# Patient Record
Sex: Female | Born: 1985 | Race: Black or African American | Hispanic: No | Marital: Single | Smoking: Current every day smoker
Health system: Southern US, Community
[De-identification: ages and names within clinical notes are randomized; demographics above are authoritative.]

## PROBLEM LIST (undated history)

## (undated) DIAGNOSIS — Z789 Other specified health status: Secondary | ICD-10-CM

## (undated) HISTORY — PX: NOSE SURGERY: SHX723

---
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
## 2006-03-15 ENCOUNTER — Emergency Department (HOSPITAL_COMMUNITY): Admission: EM | Admit: 2006-03-15 | Discharge: 2006-03-15 | Payer: Self-pay | Admitting: Family Medicine

## 2006-06-17 ENCOUNTER — Inpatient Hospital Stay (HOSPITAL_COMMUNITY): Admission: AD | Admit: 2006-06-17 | Discharge: 2006-06-17 | Payer: Self-pay | Admitting: Obstetrics & Gynecology

## 2006-10-06 ENCOUNTER — Ambulatory Visit (HOSPITAL_COMMUNITY): Admission: RE | Admit: 2006-10-06 | Discharge: 2006-10-06 | Payer: Self-pay | Admitting: Obstetrics

## 2006-12-20 ENCOUNTER — Inpatient Hospital Stay (HOSPITAL_COMMUNITY): Admission: AD | Admit: 2006-12-20 | Discharge: 2006-12-20 | Payer: Self-pay | Admitting: Obstetrics

## 2006-12-25 ENCOUNTER — Inpatient Hospital Stay (HOSPITAL_COMMUNITY): Admission: AD | Admit: 2006-12-25 | Discharge: 2006-12-25 | Payer: Self-pay | Admitting: Obstetrics

## 2007-01-30 ENCOUNTER — Inpatient Hospital Stay (HOSPITAL_COMMUNITY): Admission: AD | Admit: 2007-01-30 | Discharge: 2007-01-30 | Payer: Self-pay | Admitting: Obstetrics

## 2007-01-31 ENCOUNTER — Inpatient Hospital Stay (HOSPITAL_COMMUNITY): Admission: AD | Admit: 2007-01-31 | Discharge: 2007-02-02 | Payer: Self-pay | Admitting: Obstetrics

## 2007-02-03 HISTORY — PX: SALPINGECTOMY: SHX328

## 2007-05-15 ENCOUNTER — Inpatient Hospital Stay (HOSPITAL_COMMUNITY): Admission: AD | Admit: 2007-05-15 | Discharge: 2007-05-15 | Payer: Self-pay | Admitting: Obstetrics

## 2007-05-18 ENCOUNTER — Inpatient Hospital Stay (HOSPITAL_COMMUNITY): Admission: AD | Admit: 2007-05-18 | Discharge: 2007-05-18 | Payer: Self-pay | Admitting: Obstetrics

## 2007-05-27 ENCOUNTER — Inpatient Hospital Stay (HOSPITAL_COMMUNITY): Admission: AD | Admit: 2007-05-27 | Discharge: 2007-05-27 | Payer: Self-pay | Admitting: Obstetrics

## 2007-06-16 ENCOUNTER — Inpatient Hospital Stay (HOSPITAL_COMMUNITY): Admission: AD | Admit: 2007-06-16 | Discharge: 2007-06-19 | Payer: Self-pay | Admitting: Obstetrics

## 2007-06-16 ENCOUNTER — Encounter (INDEPENDENT_AMBULATORY_CARE_PROVIDER_SITE_OTHER): Payer: Self-pay | Admitting: Obstetrics

## 2007-08-03 IMAGING — US US OB COMP +14 WK
1 series · 13 of 28 positions shown · non-contrast
Comparison: none

CLINICAL DATA: Uncertain menstrual dates.  Approximately 13 weeks pregnant.

[Series 1: us ob comp +14 wk · 0.22mm/px · 13 of 43 slices shown]
[im 2/43]
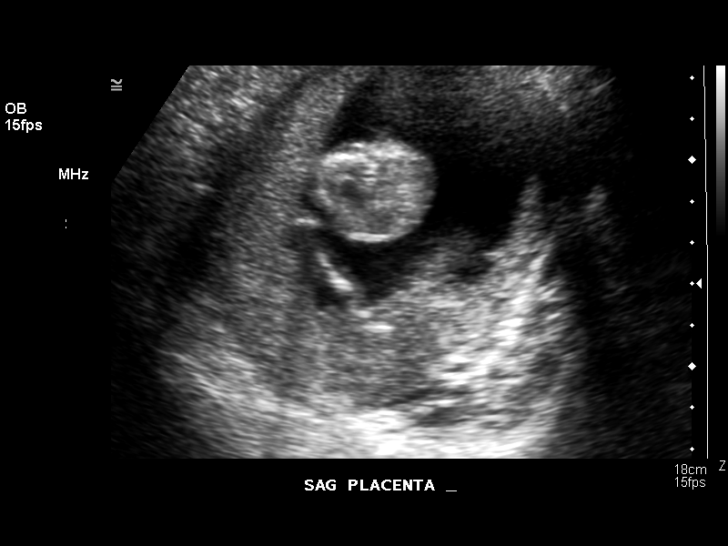
[im 5/43]
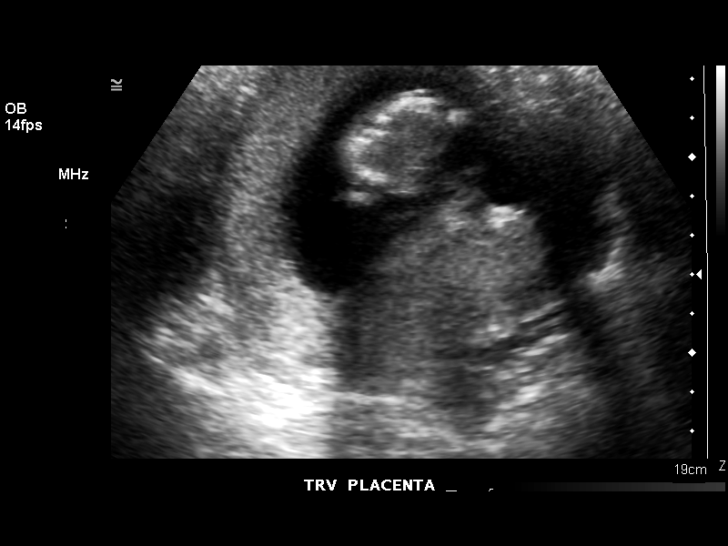
[im 8/43]
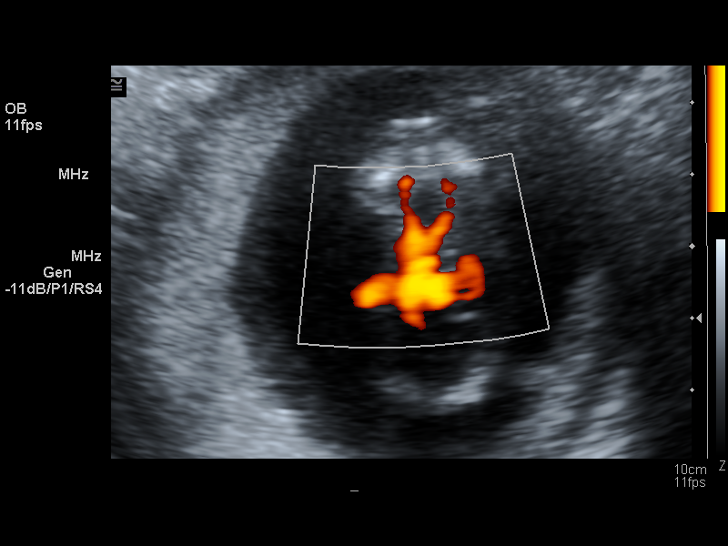
[im 11/43]
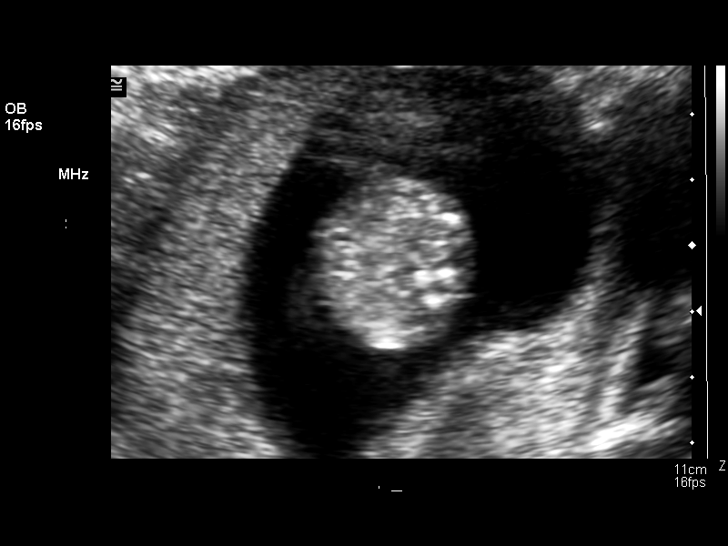
[im 15/43]
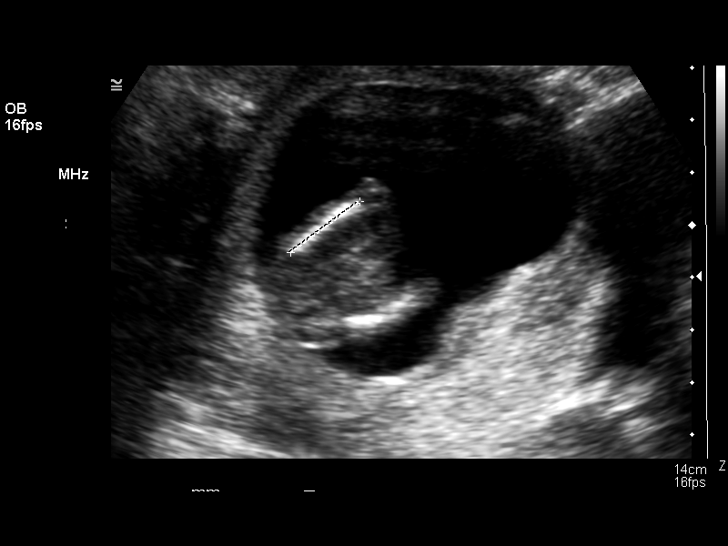
[im 18/43]
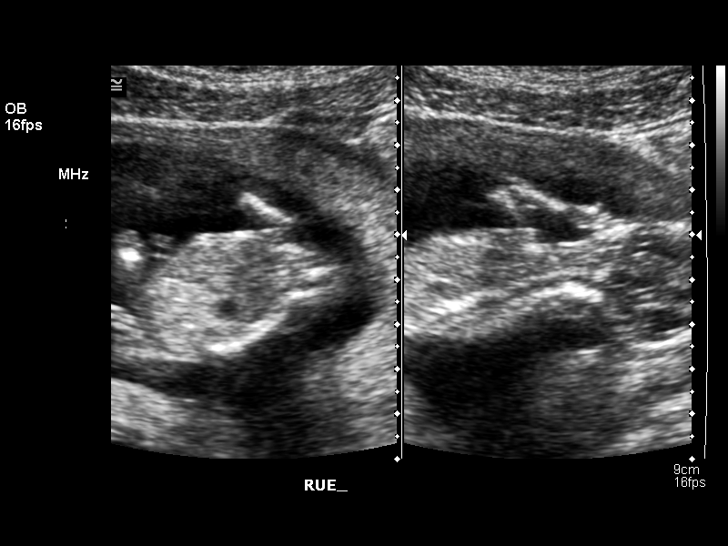
[im 22/43]
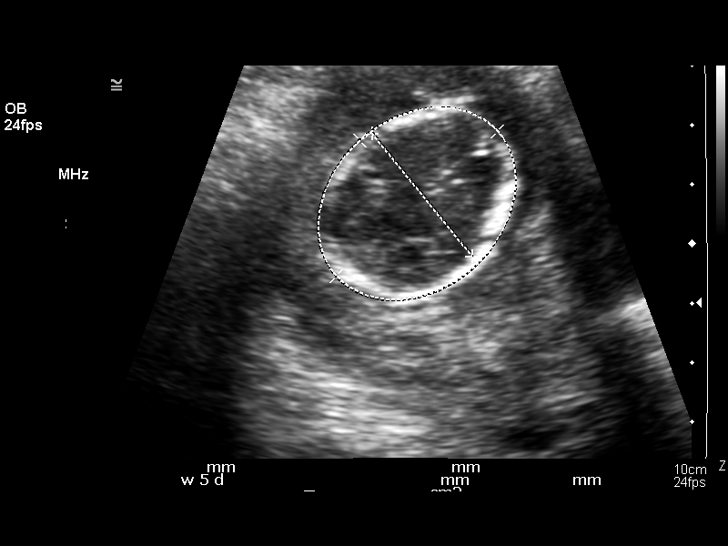
[im 25/43]
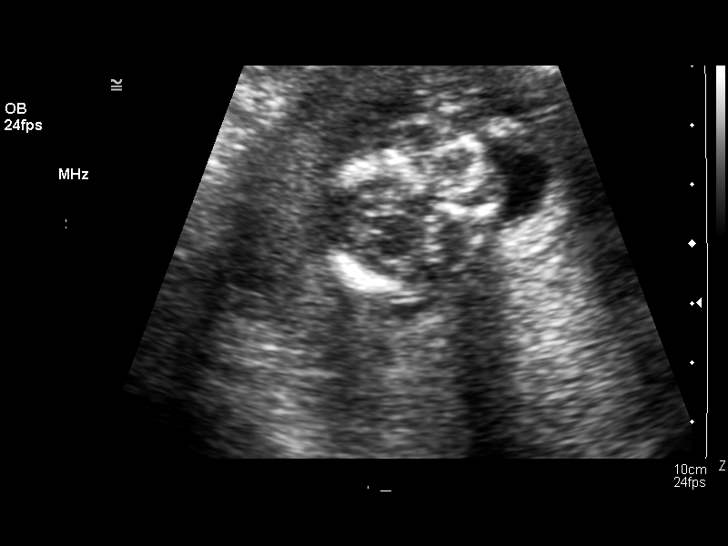
[im 29/43]
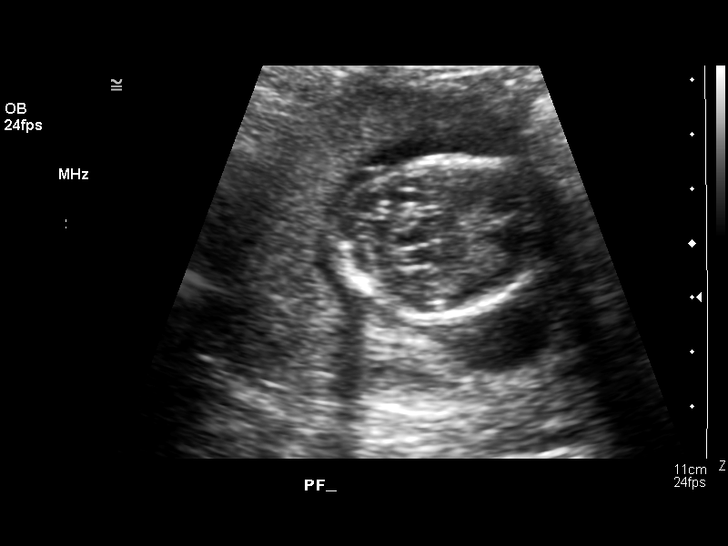
[im 32/43]
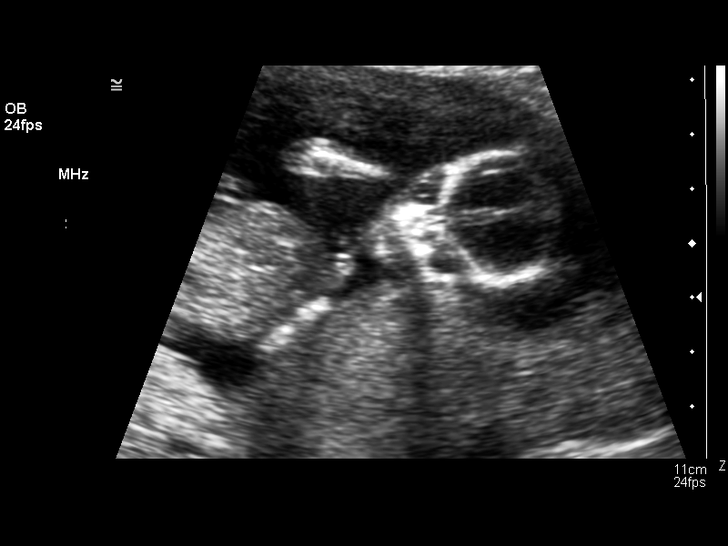
[im 35/43]
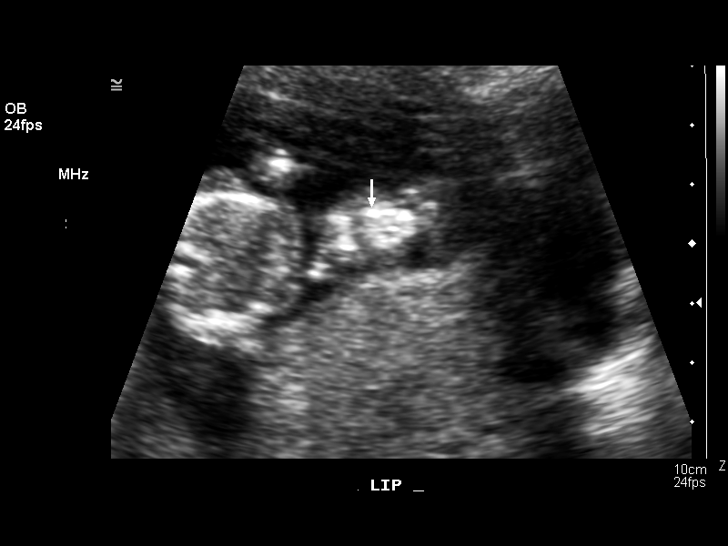
[im 38/43]
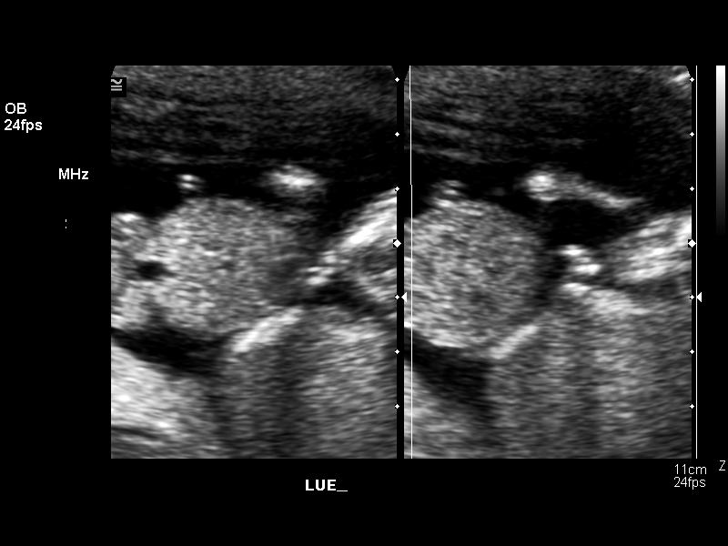
[im 41/43]
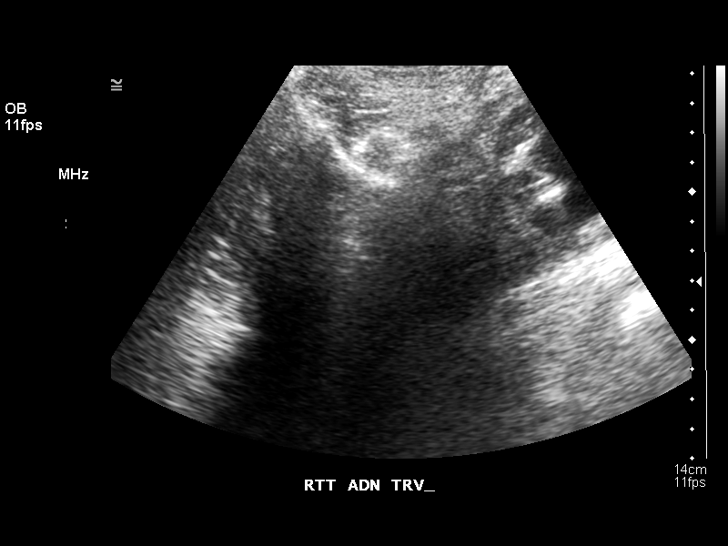

[13 of 28 positions shown; findings below may reference images not displayed]

OBSTETRICAL ULTRASOUND:
 Number of Fetuses:  1
 Heart Rate:  175
 Movement:  Yes
 Breathing:  No  
 Presentation:  Transverse with head to maternal left
 Placental Location:  Posterior, right lateral 
 Grade:  0
 Amniotic Fluid (Subjective):  Normal
 Amniotic Fluid (Objective):   2.7 cm Vertical pocket 

 FETAL BIOMETRY
 BPD:   2.7cm   14 w 5 d
 HC:   10.3 cm  14 w 6 d
 AC:    8.3 cm   14 w 5 d
 FL:    1.6 cm  14 w 4 d

 MEAN GA:  14 w 5 d

 FETAL ANATOMY
 Lateral Ventricles:    Visualized 
 Thalami/CSP:      Visualized 
 Posterior Fossa:  Visualized 
 Nuchal Region:    Not visualized 
 Spine:      Not visualized 
 4 Chamber Heart on Left:      Not visualized 
 Stomach on Left:      Visualized 
 3 Vessel Cord:    Visualized 
 Cord Insertion site:    Visualized 
 Kidneys:  Visualized 
 Bladder:  Visualized 
 Extremities:      Visualized 
 Evaluation limited by:  Early gestational age 

 MATERNAL UTERINE AND ADNEXAL FINDINGS
 Cervix: Not evaluated < 16 weeks
IMPRESSION: 1.  Single living intrauterine fetus with mean gestational age of 14 weeks 5 days and sonographic EDC of 07/09/05.
 2.  No early fetal abnormality identified, although exam is limited by early gestational age.  Complete fetal anatomic evaluation could be performed optimally in 3 ? 4 weeks.

## 2008-03-15 ENCOUNTER — Emergency Department (HOSPITAL_COMMUNITY): Admission: EM | Admit: 2008-03-15 | Discharge: 2008-03-15 | Payer: Self-pay | Admitting: Emergency Medicine

## 2008-07-12 ENCOUNTER — Inpatient Hospital Stay (HOSPITAL_COMMUNITY): Admission: AD | Admit: 2008-07-12 | Discharge: 2008-07-12 | Payer: Self-pay | Admitting: Obstetrics

## 2009-01-04 IMAGING — US US OB COMP LESS 14 WK
1 series · 14 of 28 positions shown · non-contrast
Comparison: none

OBSTETRICAL ULTRASOUND:

 This ultrasound exam was performed in the [HOSPITAL] Ultrasound Department.  The OB US report was generated in the AS system, and faxed to the ordering physician.  This report is also available in [REDACTED] PACS.

[Series 1: us ob comp less 14 wk · 0.22mm/px · 14 of 29 slices shown]
[im 2/29]
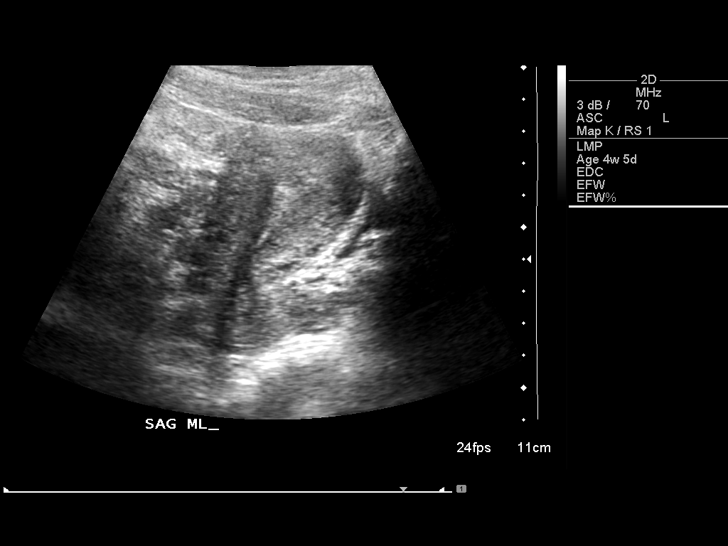
[im 4/29]
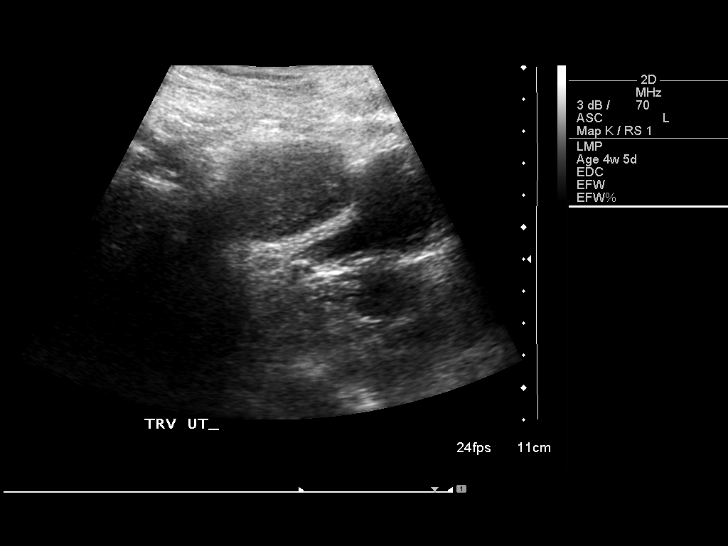
[im 6/29]
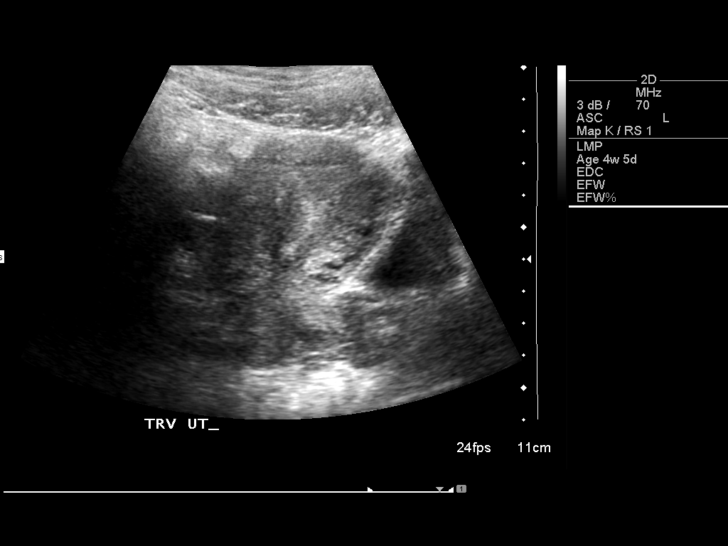
[im 8/29]
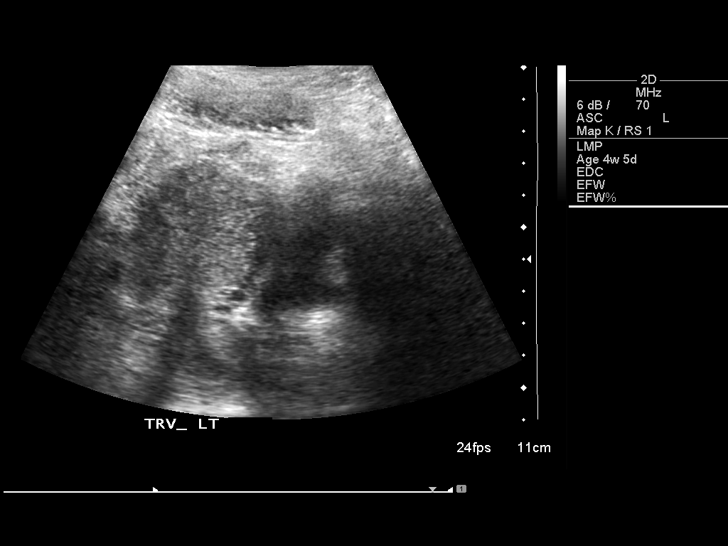
[im 10/29]
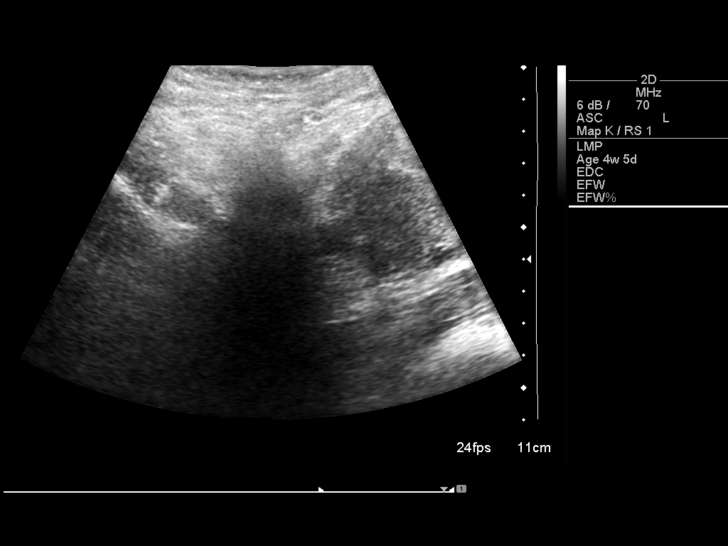
[im 12/29]
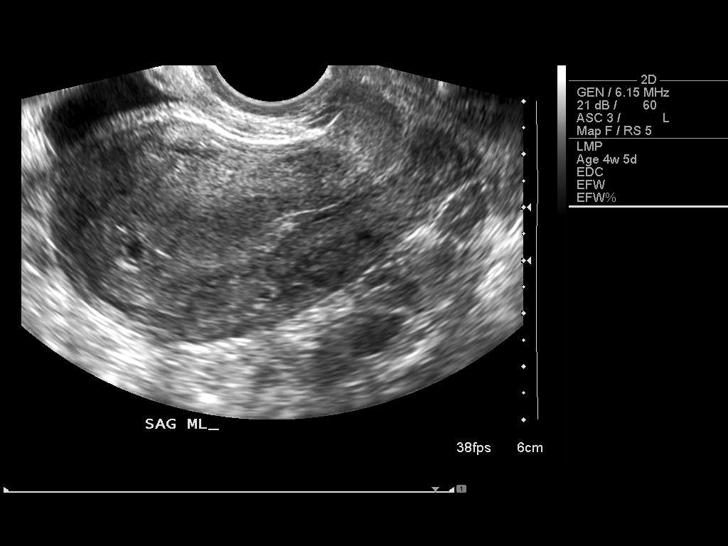
[im 14/29]
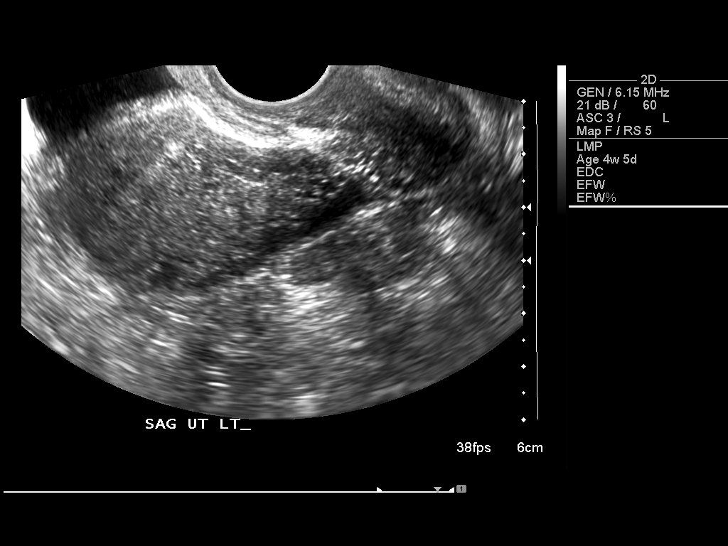
[im 16/29]
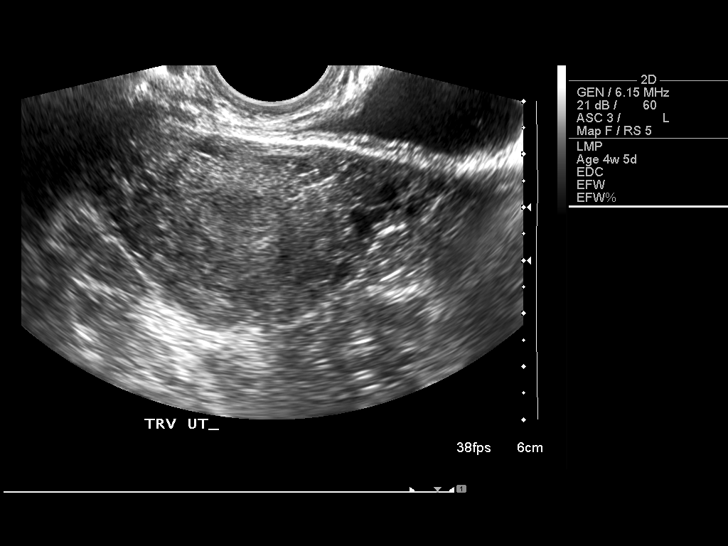
[im 18/29]
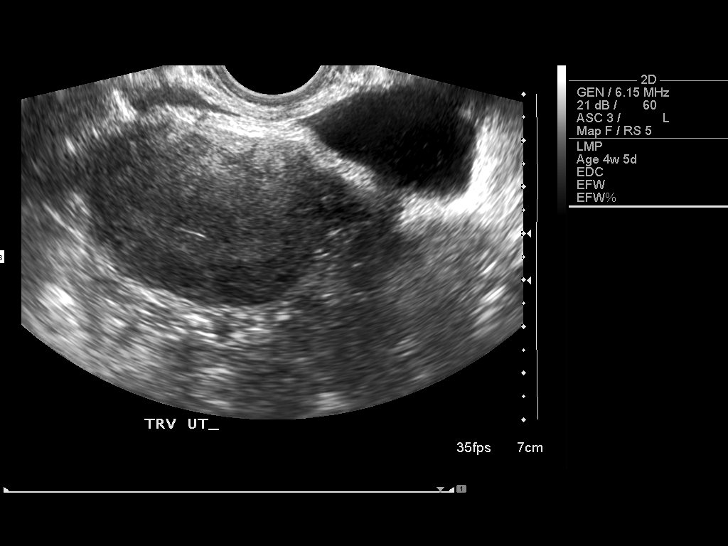
[im 20/29]
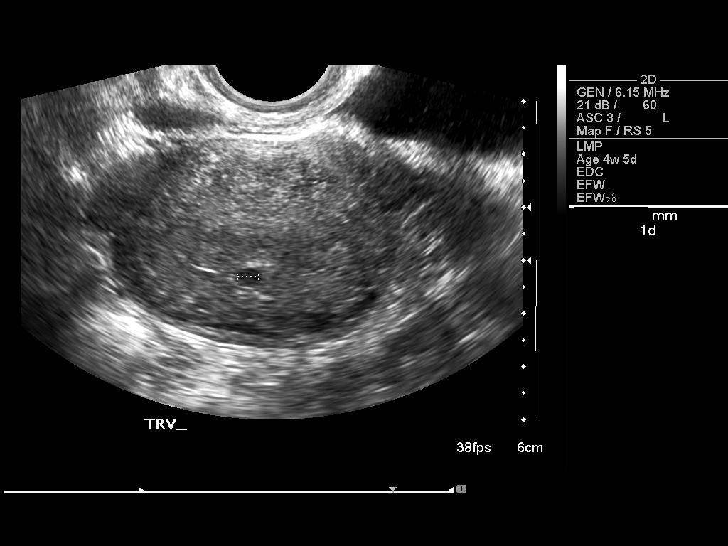
[im 22/29]
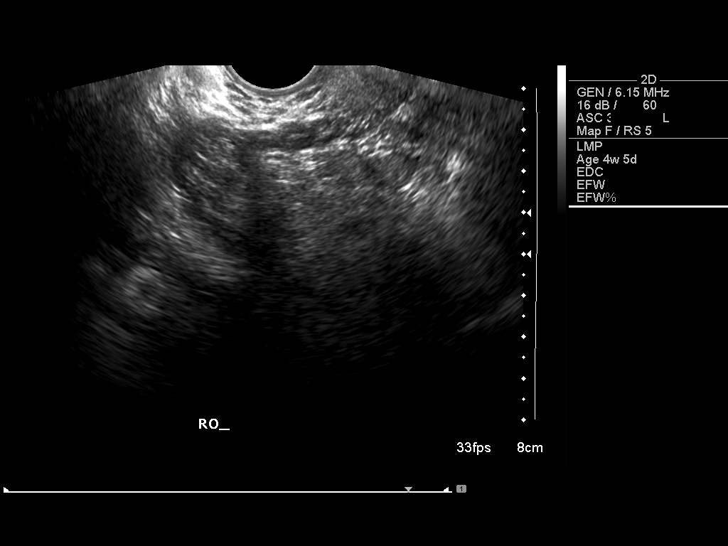
[im 24/29]
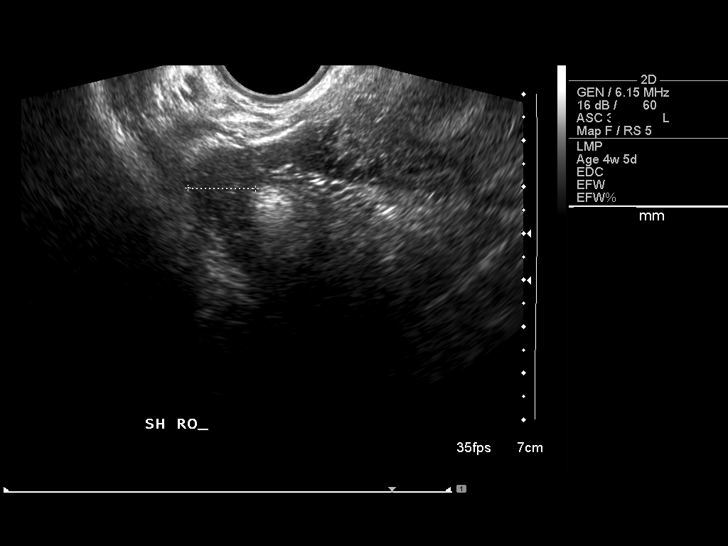
[im 26/29]
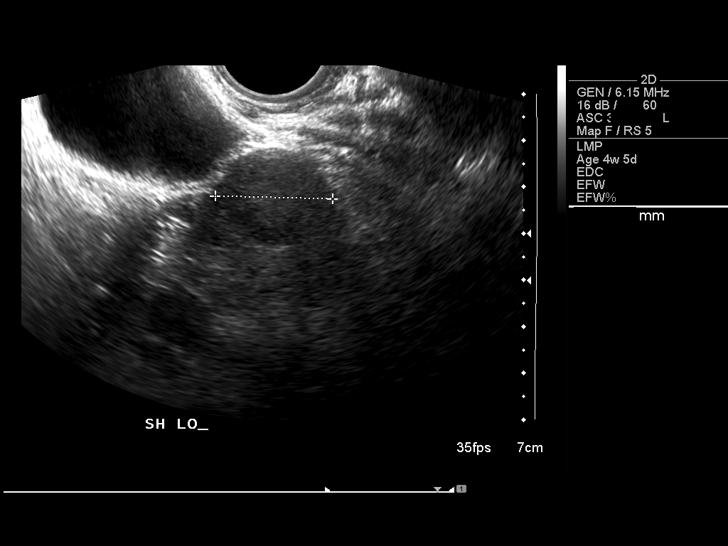
[im 29/29]
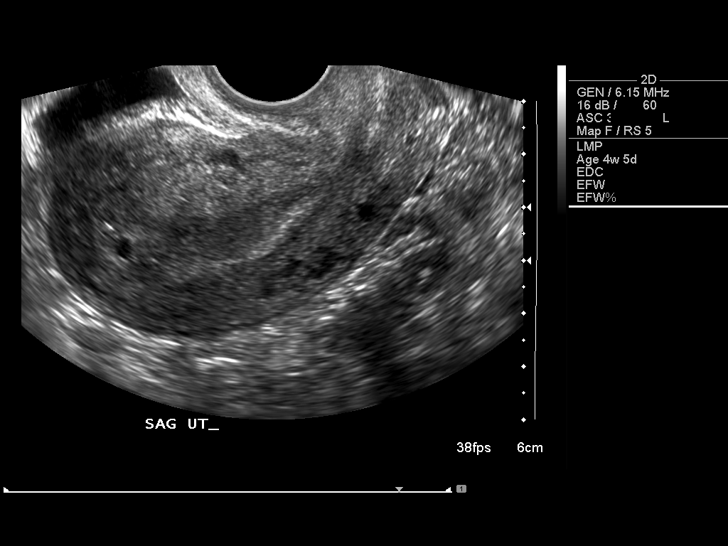

[14 of 28 positions shown; findings below may reference images not displayed]

IMPRESSION: See AS Obstetric US report.

## 2009-12-05 IMAGING — US US OB COMP LESS 14 WK
1 series · 14 of 28 positions shown · non-contrast
Comparison: none

OBSTETRICAL ULTRASOUND:
 This ultrasound exam was performed in the [HOSPITAL] Ultrasound Department.  The OB US report was generated in the AS system, and faxed to the ordering physician.  This report is also available in [REDACTED] PACS.

[Series 1: us ob comp less 14 wk · 0.23mm/px · 14 of 35 slices shown]
[im 2/35]
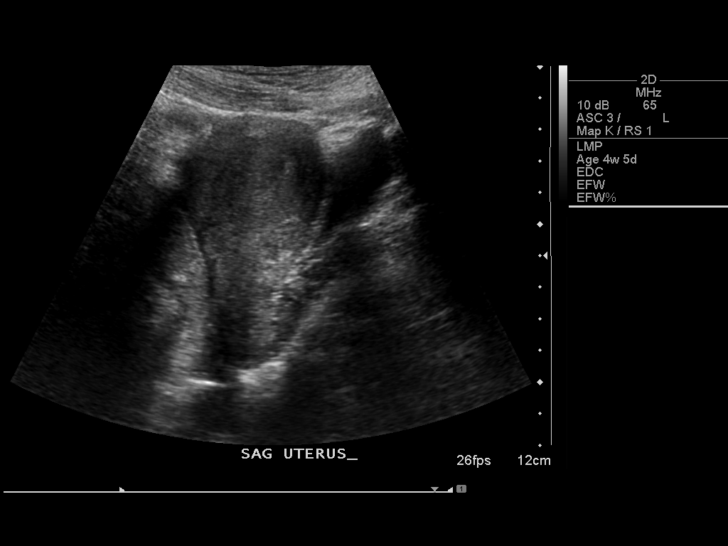
[im 4/35]
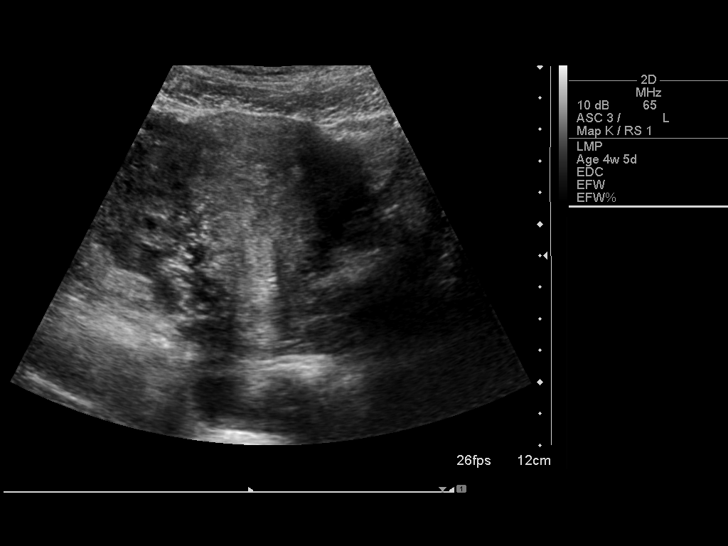
[im 7/35]
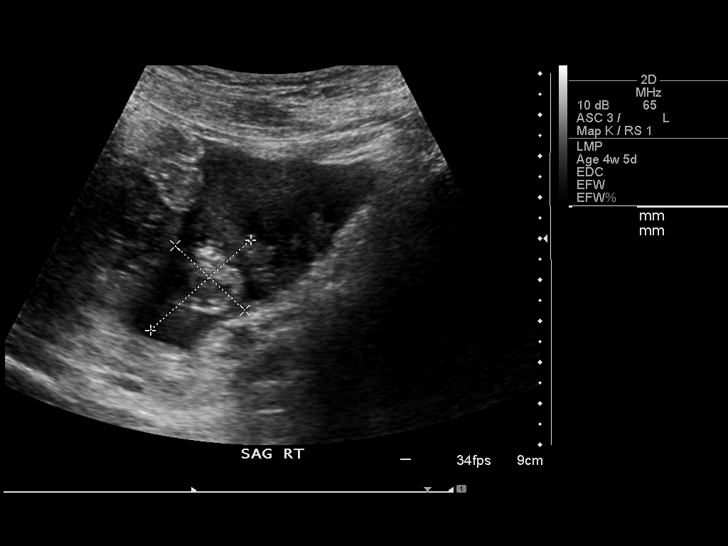
[im 9/35]
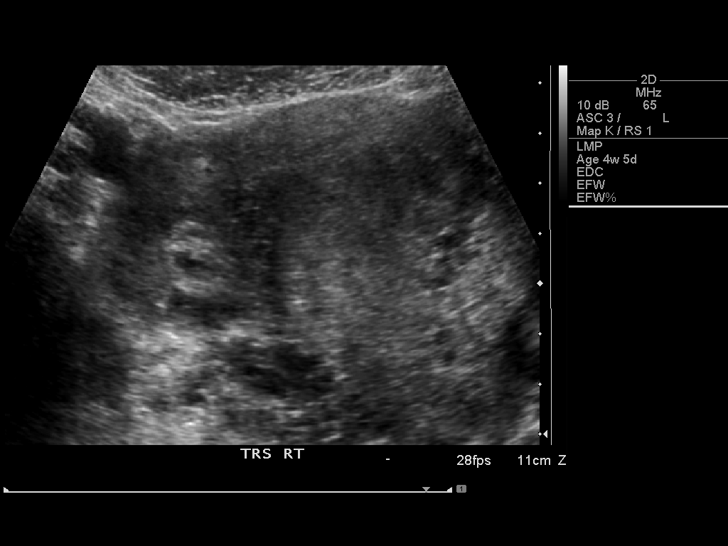
[im 12/35]
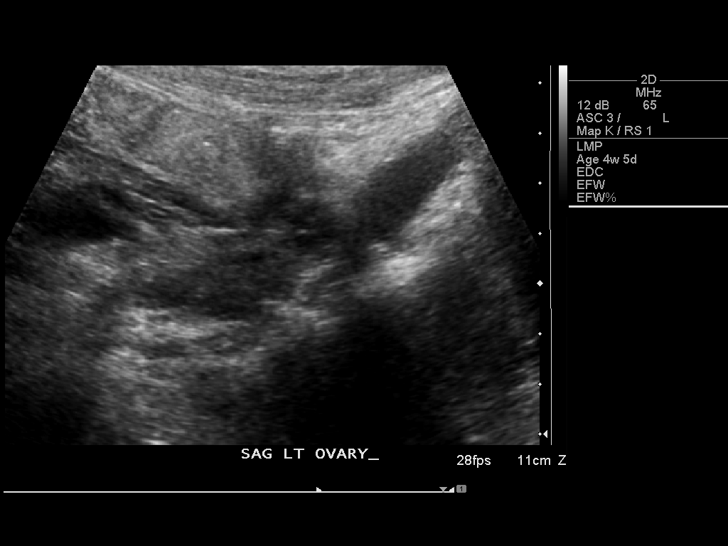
[im 14/35]
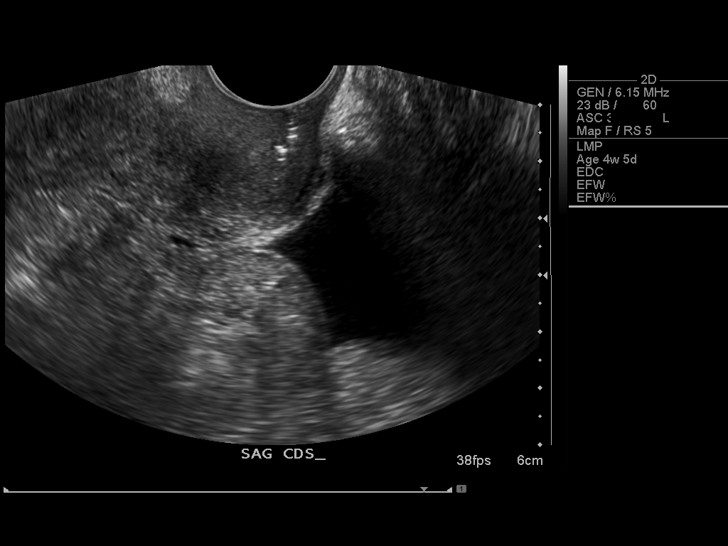
[im 17/35]
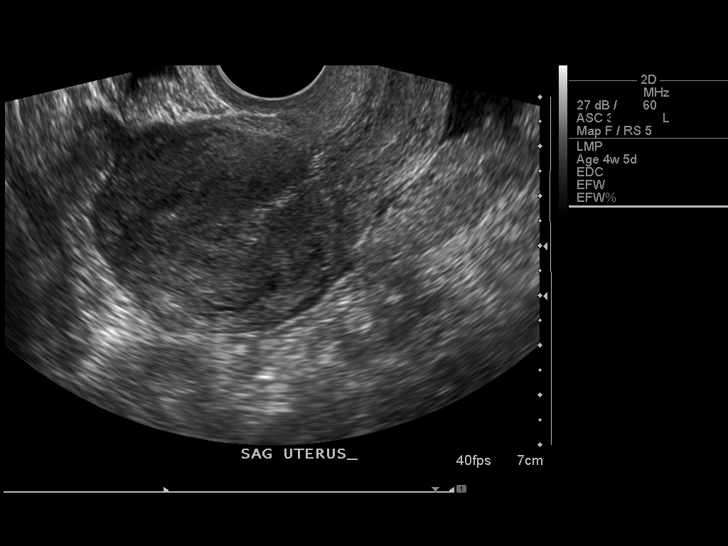
[im 19/35]
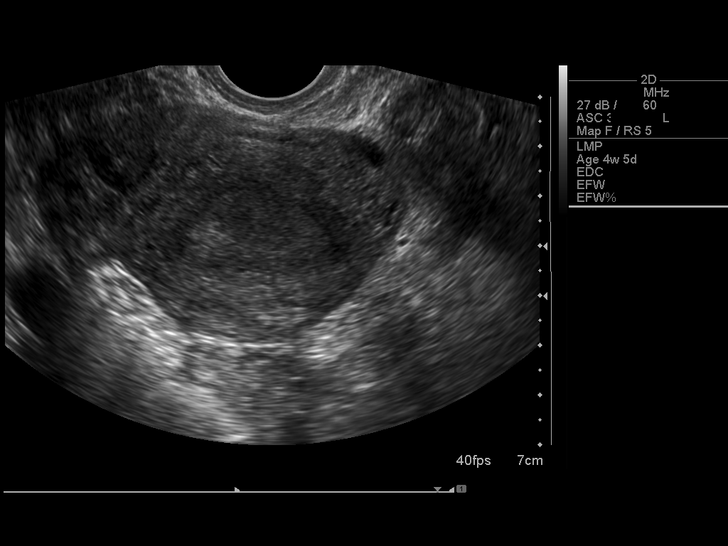
[im 22/35]
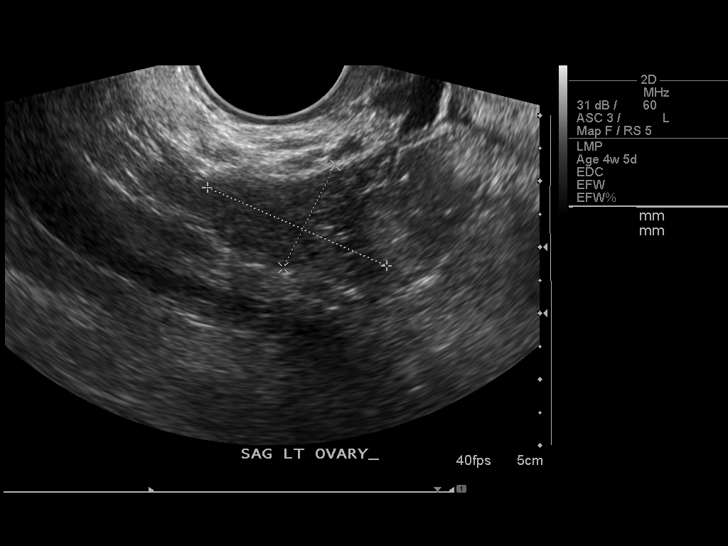
[im 24/35]
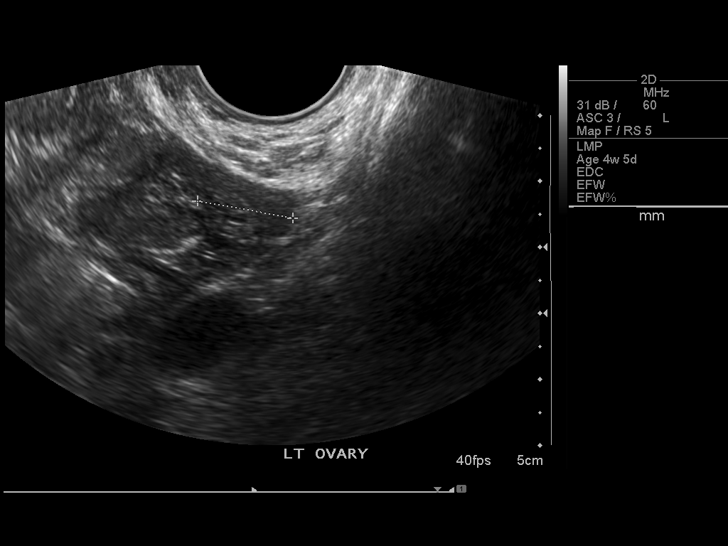
[im 27/35]
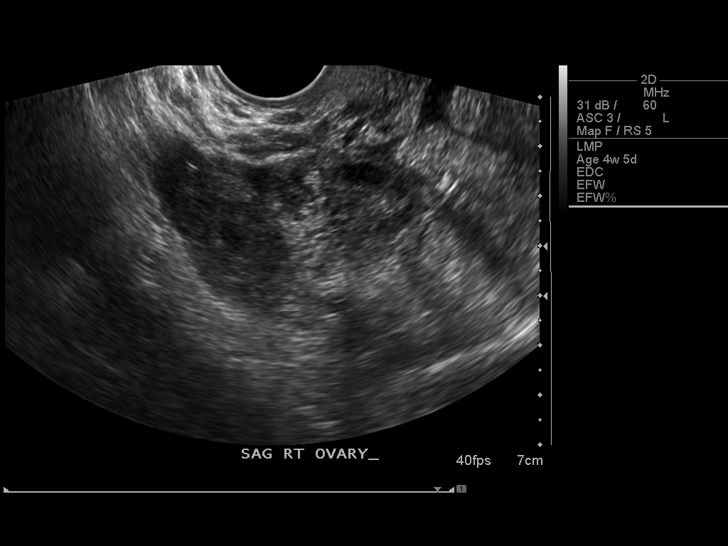
[im 29/35]
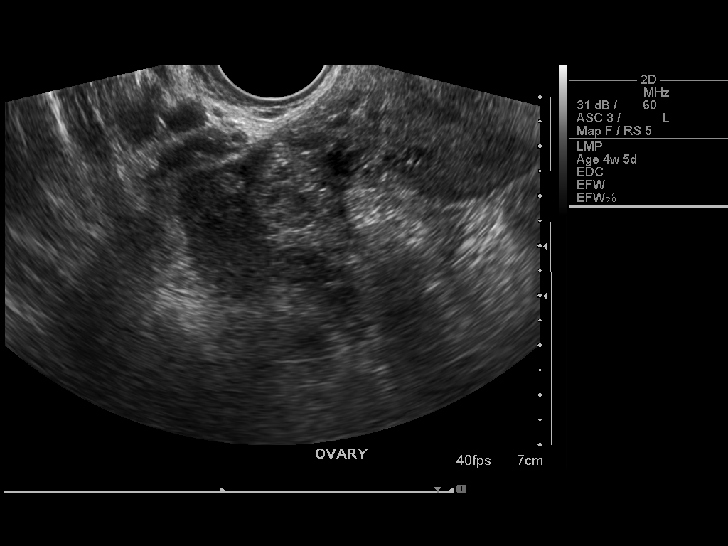
[im 32/35]
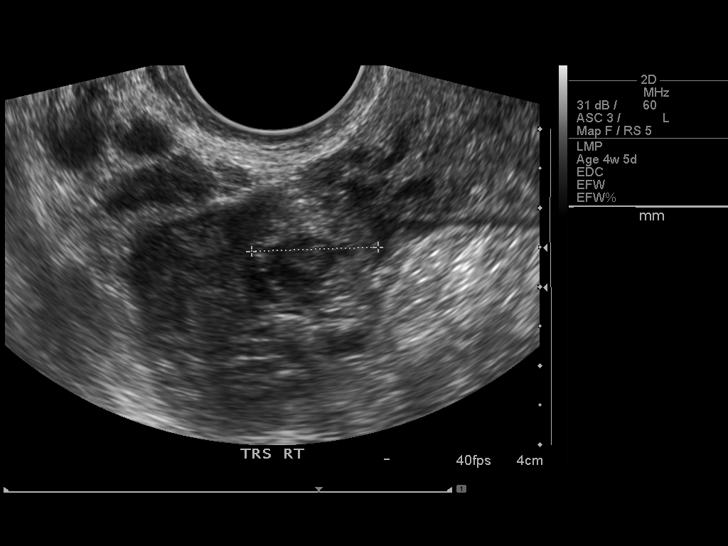
[im 35/35]
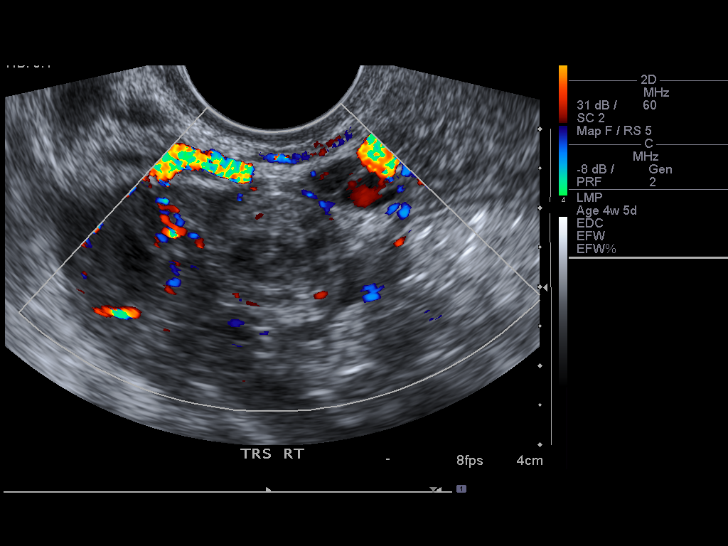

[14 of 28 positions shown; findings below may reference images not displayed]

IMPRESSION: See AS Obstetric US report.

## 2010-04-24 ENCOUNTER — Inpatient Hospital Stay (HOSPITAL_COMMUNITY)
Admission: AD | Admit: 2010-04-24 | Discharge: 2010-04-25 | Disposition: A | Discharge status: Home / Self Care | Payer: Self-pay | Source: Ambulatory Visit | Attending: Obstetrics | Admitting: Obstetrics

## 2010-04-24 DIAGNOSIS — R112 Nausea with vomiting, unspecified: Secondary | ICD-10-CM | POA: Insufficient documentation

## 2010-04-24 DIAGNOSIS — R109 Unspecified abdominal pain: Secondary | ICD-10-CM

## 2010-04-24 DIAGNOSIS — N760 Acute vaginitis: Secondary | ICD-10-CM

## 2010-04-24 DIAGNOSIS — A499 Bacterial infection, unspecified: Secondary | ICD-10-CM

## 2010-04-24 DIAGNOSIS — B9689 Other specified bacterial agents as the cause of diseases classified elsewhere: Secondary | ICD-10-CM | POA: Insufficient documentation

## 2010-04-25 LAB — URINALYSIS, ROUTINE W REFLEX MICROSCOPIC
Bilirubin Urine: NEGATIVE
Hgb urine dipstick: NEGATIVE
Nitrite: NEGATIVE
Protein, ur: NEGATIVE mg/dL
Urobilinogen, UA: 0.2 mg/dL (ref 0.0–1.0)

## 2010-04-25 LAB — CBC
MCH: 28.3 pg (ref 26.0–34.0)
MCHC: 32.7 g/dL (ref 30.0–36.0)
RDW: 12.5 % (ref 11.5–15.5)

## 2010-04-25 LAB — WET PREP, GENITAL
Trich, Wet Prep: NONE SEEN
Yeast Wet Prep HPF POC: NONE SEEN

## 2010-04-25 LAB — HCG, QUANTITATIVE, PREGNANCY: hCG, Beta Chain, Quant, S: <2 m[IU]/mL (ref <5)

## 2010-04-26 LAB — GC/CHLAMYDIA PROBE AMP, GENITAL: Chlamydia, DNA Probe: POSITIVE — AB

## 2010-05-12 LAB — CBC
Hemoglobin: 11.8 g/dL — ABNORMAL LOW (ref 12.0–15.0)
MCHC: 34.3 g/dL (ref 30.0–36.0)
MCV: 87.4 fL (ref 78.0–100.0)
Platelets: 184 10*3/uL (ref 150–400)
RDW: 12.4 % (ref 11.5–15.5)
WBC: 5.1 10*3/uL (ref 4.0–10.5)

## 2010-05-12 LAB — GC/CHLAMYDIA PROBE AMP, GENITAL: GC Probe Amp, Genital: NEGATIVE

## 2010-05-12 LAB — URINALYSIS, ROUTINE W REFLEX MICROSCOPIC
Protein, ur: NEGATIVE mg/dL
pH: 7 (ref 5.0–8.0)

## 2010-05-12 LAB — WET PREP, GENITAL
Clue Cells Wet Prep HPF POC: NONE SEEN
Yeast Wet Prep HPF POC: NONE SEEN

## 2010-05-12 LAB — URINE MICROSCOPIC-ADD ON

## 2010-05-12 LAB — ABO/RH: ABO/RH(D): A POS

## 2010-05-12 LAB — POCT PREGNANCY, URINE: Preg Test, Ur: NEGATIVE

## 2010-05-20 LAB — POCT PREGNANCY, URINE: Preg Test, Ur: NEGATIVE

## 2010-06-17 NOTE — Op Note (Signed)
[name], [name]              ACCOUNT NO.:  [id number]   MEDICAL RECORD NO.:  [id number]          PATIENT TYPE:  INP   LOCATION:  9316                          FACILITY:  [facility]   PHYSICIAN:  Kathreen Cosier, M.D.DATE OF BIRTH:  February 22, 1985   DATE OF PROCEDURE:  06/16/2007  DATE OF DISCHARGE:                               OPERATIVE REPORT   PREOPERATIVE DIAGNOSIS:  Ruptured ectopic pregnancy.   POSTOPERATIVE DIAGNOSIS:  Ruptured ectopic pregnancy.   ANESTHESIA:  General.   PROCEDURE:  The patient was placed on the operating table in a supine  position.  General anesthesia was administered.  Abdomen was prepped and  draped.  Bladder emptied with Foley catheter.  Transverse suprapubic  mini lap was made, carried down to the fascia.  Fascia cleaned and  incised to the length of the incision.  Recti muscles retracted  laterally.  The peritoneum was incised longitudinally.  There was in  excess of 250 mL of free blood in the abdominal cavity, large amount of  clots.  The left tube and ovary appeared normal.  On the right side, the  tube was intimately adherent with the surrounding bowel, and the tissue  was extremely edematous.  The incision was extended and the right tube  dissected with blunt and sharp dissection from the surrounding tissues.  She had a ruptured ampullary ectopic pregnancy.  Kelly clamps were  placed across the mesosalpinx below the tube and the free portion of the  right tube was removed.  Hemostasis was achieved with interrupted  sutures of 0-chromic.  The surrounding bowel was normal and the  operative site was irrigated.  Hemostasis was satisfactory.  The ovary  on the right was adherent in the cul-de-sac.  Some Avitene was placed at  the operative site, and the lap and sponge counts were correct.  Abdomen  closed in layers, peritoneum with continuous suture of 0-chromic, fascia  with continuous suture of 0 Dexon, and the skin closed with subcuticular  stitch of 4-0 Monocryl.  Blood loss was between 250-300 mL.  The patient  tolerated the procedure well and taken to recovery room in good  condition.           ______________________________  Kathreen Cosier, M.D.     BAM/MEDQ  D:  06/16/2007  T:  06/16/2007  Job:  [id number]

## 2010-06-20 NOTE — Discharge Summary (Signed)
NAMEARRYANNA, HOLQUIN              ACCOUNT NO.:  [id number]   MEDICAL RECORD NO.:  [id number]          PATIENT TYPE:  INP   LOCATION:  9316                          [facility]ACILITY:  [facility]   PHYSICIAN:  Kathreen Cosier, M.D.DATE OF BIRTH:  Dec 02, 1985   DATE OF ADMISSION:  06/16/2007  DATE OF DISCHARGE:  06/19/2007                               DISCHARGE SUMMARY   The patient is a 25 year old female with abdominal pain.  She was  evaluated on May 15, 2007, and had an ectopic pregnancy which was  small.  She was treated with methotrexate and had a second dose on May 27, 2007, and the patient did not follow up with the hospital or her  physician.  She disappeared for 20 days and then came in with ruptured  ectopic pregnancy.  She was taken to operating room and a minilap  performed.  Left tube and ovary normal, and on the right side, there  were multiple adhesions between the uterus, tube, and bowel.  The right  tube was removed in pieces and hemostasis was satisfactory.  She was  discharged on the third postoperative day, ambulatory on a regular diet  on ferrous sulfate for anemia, Tylox for pain.   DISCHARGE DIAGNOSIS:  Ruptured ectopic pregnancy.           ______________________________  Kathreen Cosier, M.D.     BAM/MEDQ  D:  07/13/2007  T:  07/13/2007  Job:  [id number]

## 2010-06-20 NOTE — H&P (Signed)
NAMERIDHIMA, GOLBERG              ACCOUNT NO.:  [id number]   MEDICAL RECORD NO.:  [id number]          PATIENT TYPE:  INP   LOCATION:  9156                          FACILITY:  [facility]   PHYSICIAN:  Roseanna Rainbow, M.D.DATE OF BIRTH:  [birth year]   DATE OF ADMISSION:  05/07/2005  DATE OF DISCHARGE:                                HISTORY & PHYSICAL   CHIEF COMPLAINT:  The patient is an 25 year old gravida 1, para 0 with an  estimated date of confinement of July 09, 2005 with an intrauterine pregnancy  of 31 weeks complaining of episodic back pain and pelvic pressure.   HISTORY OF PRESENT ILLNESS:  Please see the above.  The patient had an  ultrasound on March 26th.  The amniotic fluid was low-normal at 10.7.  There  is no previa posterior placenta.  Estimated weight percentile was 50-75th  percentile for 30 weeks.  The cervix measured 2.4 cm transvaginally, and  there was no dilatation.  She was monitored in the maternity admissions unit  and found not to be contracting and seen in the office the next day and was  felt not to be in preterm labor; however, she returned today and was  complaining of episodic lower back pain with a significant increase in  pelvic pressure.  She denied any watery discharge or vaginal bleeding.  She  also denied recent coitus and dysuria or frequency.   ALLERGIES:  No known drug allergies.   MEDICATIONS:  Prenatal vitamins.   LABS:  Hemoglobin 11.3, hematocrit 34.4.  Chlamydia on March 24th negative.  On December 14th, the Chlamydia was positive.  GC negative.  Urine culture  and sensitivity staph aureus.  One hour GCT 89.  Hepatitis B surface antigen  negative.  HIV nonreactive.  Blood type A+.  Antibody screen negative.  Sickle cell negative.  Rubella immune.   RISK FACTORS:  1.  Threatened preterm labor.  2.  Urinary tract infection.  3.  Positive Chlamydia DNA probe.   PAST OB/GYN HISTORY:  Noncontributory.   PAST MEDICAL HISTORY:  No  significant history of medical diseases.   PAST SURGICAL HISTORY:  Hemangioma resection.   SOCIAL HISTORY:  She is employed as a Conservation officer, [employer].  She is single.  Does not  give any significant history of alcohol usage.  Has no significant smoking  history.  Denies illicit drug use.   FAMILY HISTORY:  Depression, hypertension.   PHYSICAL EXAMINATION:  VITAL SIGNS:  Blood pressure 88/59.  GENERAL:  A well-developed and well-nourished African-American female in no  apparent distress.  ABDOMEN:  Gravid.  Fetal heart tones 140s.  Fundal height 31 cm.  PELVIC:  Sterile vaginal exam:  The cervix is 2 cm dilated and 60% effaced  with the vertex at a -3, -2 station.   Urinalysis 1+ glycosuria.   ASSESSMENT:  Primigravida with an intrauterine pregnancy of 31 weeks.  Threatened preterm labor.   PLAN:  Admission.  Tocolytics as needed.  Bedrest.  Steroids.      Roseanna Rainbow, M.D.  Electronically Signed     LAJ/MEDQ  D:  05/07/2005  T:  05/07/2005  Job:  [id number]

## 2010-06-20 NOTE — H&P (Signed)
[name], [name]              ACCOUNT NO.:  [id number]   MEDICAL RECORD NO.:  [id number]          PATIENT TYPE:  INP   LOCATION:  9164                          [facility]ACILITY:  [facility]   PHYSICIAN:  Roseanna Rainbow, M.D.DATE OF BIRTH:  1985-02-04   DATE OF ADMISSION:  06/21/2005  DATE OF DISCHARGE:                                HISTORY & PHYSICAL   CHIEF COMPLAINT:  The patient is an 25 year old, gravida 1, para 0, with an  intrauterine pregnancy at 37+ weeks complaining of contractions and leaking  fluid.   HISTORY OF PRESENT ILLNESS:  Please see the above.   ALLERGIES:  No known drug allergies.   MEDICATIONS:  Prenatal vitamins.   RISK FACTORS:  1.  Threatened preterm labor.  2.  Adolescent.  3.  Positive Chlamydia DNA probe.  4.  Asymptomatic bacteruria.   LABS:  Antibody screen was negative.  Blood type was A positive.  Hemoglobin  11.3, hematocrit 32.8, platelets 165,000.  Chlamydia negative on March 27.  GC negative on March 24.  Urine culture and sensitivity showed staph aureus.  One-hour GCT 89.  HIV was nonreactive.  RPR was nonreactive.  Rubella  immune.  Sickle cell was negative.   PAST OB/GYN HISTORY:  Noncontributory.   PAST MEDICAL HISTORY:  No significant history of medical diseases.   PAST SURGICAL HISTORY:  Hemangioma resection.   SOCIAL HISTORY:  She is a Conservation officer, nature, single.  Does not give any significant  history of alcohol usage.  Has no significant smoking history.  Denies  illicit drug use.   FAMILY HISTORY:  Depression, hypertension.   PHYSICAL EXAMINATION:  VITAL SIGNS:  Temperature 97.6, pulse 90, respiratory  rate 18, blood pressure 115/65.  Fetal heart tracing is reassuring.  On  Tocodynamometer, regular uterine contractions.  STERILE VAGINAL EXAM:  Per the RN, the cervix is 4 cm dilated.   ASSESSMENT:  Primigravida with an intrauterine pregnancy at 37+ weeks, early  labor, rule out ruptured membranes.   PLAN:  1.  We will check AFI.  2.   Observation for now.      Roseanna Rainbow, M.D.  Electronically Signed     LAJ/MEDQ  D:  06/22/2005  T:  06/22/2005  Job:  [id number]

## 2010-06-20 NOTE — Discharge Summary (Signed)
NAMECARLISSA, [name]              ACCOUNT NO.:  [id number]   MEDICAL RECORD NO.:  [id number]          PATIENT TYPE:  INP   LOCATION:  9157                          [facility]ACILITY:  [facility]   PHYSICIAN:  Charles A. Clearance Coots, M.D.DATE OF BIRTH:  08-10-1985   DATE OF ADMISSION:  05/30/2005  DATE OF DISCHARGE:  06/04/2005                                 DISCHARGE SUMMARY   ADMITTING DIAGNOSES:  1.  Thirty four weeks gestation.  2.  Preterm labor.   DISCHARGE DIAGNOSES:  1.  Thirty four weeks gestation.  2.  Preterm labor.  3.  Status post magnesium sulfate tocolysis.  4.  Discharged home at [redacted] weeks gestation, undelivered, in good condition.   REASON FOR ADMISSION:  This 25 year old G1 black female, EDC of July 09, 2005 presents with uterine contractions.  The patient has had a history of  preterm labor with this pregnancy requiring admission to the hospital.   PAST MEDICAL HISTORY:  1.  Surgery none.  2.  Illnesses none.   MEDICATIONS:  Prenatal vitamins.   ALLERGIES:  NO KNOWN DRUG ALLERGIES.   SOCIAL HISTORY:  Single.  Negative tobacco, alcohol or recreational drug  use.   PHYSICAL EXAMINATION:  She is afebrile, vital are signs stable.  Lungs are  clear to auscultation bilaterally.  Heart regular rate and rhythm.  Abdomen  gravid, nontender.  Fetal heart rate 150-160 beats per minute.  Cervix is 2  cm dilated, 90% effaced and vertex at a zero station.  External fetal  monitor revealed uterine contractions every 3-5 minutes and reactive  tracing.   IMPRESSION:  Thirty four weeks gestation, preterm labor.   PLAN:  Admit, start magnesium sulfate tocolysis.   HOSPITAL COURSE:  The patient was admitted and started on magnesium sulfate  IV tocolysis.  She responded well to therapy and was having no uterine  contractions by hospital day #2.  She continued to do well and was continued  on magnesium sulfate tocolysis until hospital day #4 for which she was [redacted]  weeks gestation.  The  magnesium sulfate was discontinued on hospital day #4  and the patient was observed for 2 hours after discontinuation of the  magnesium sulfate and she was discharged home without any significant  uterine contractions for followup in the office.   DISCHARGE DISPOSITION:  1.  Medications:  Continue prenatal vitamins.  2.  Routine written instructions were given for preterm labor precautions.  3.  The patient is to call office for a follow-up appointment in 1 week.      Charles A. Clearance Coots, M.D.  Electronically Signed     CAH/MEDQ  D:  06/05/2005  T:  06/06/2005  Job:  [id number]

## 2010-06-20 NOTE — H&P (Signed)
NAMEJONELLA, REDDITT              ACCOUNT NO.:  [id number]   MEDICAL RECORD NO.:  [id number]          PATIENT TYPE:  INP   LOCATION:  9164                          [facility]ACILITY:  [facility]   PHYSICIAN:  Roseanna Rainbow, M.D.DATE OF BIRTH:  08/04/1985   DATE OF ADMISSION:  06/21/2005  DATE OF DISCHARGE:                                HISTORY & PHYSICAL   CHIEF COMPLAINT:  The patient is an 25 year old gravida 1, para 0 with an  intrauterine pregnancy at 37+ weeks   DICTATION ENDED AT THIS POINT      Roseanna Rainbow, M.D.     LAJ/MEDQ  D:  06/22/2005  T:  06/22/2005  Job:  [id number]

## 2010-06-20 NOTE — Discharge Summary (Signed)
[name], [name]              ACCOUNT NO.:  [id number]   MEDICAL RECORD NO.:  [id number]          PATIENT TYPE:  INP   LOCATION:  9156                          [facility]ILITY[facility]   PHYSICIAN:  Roseanna Rainbow, M.D.DATE OF BIRTH:  1985/12/19   DATE OF ADMISSION:  05/07/2005  DATE OF DISCHARGE:  05/11/2005                                 DISCHARGE SUMMARY   CHIEF COMPLAINT:  The patient is an 25 year old gravida 1, para 0 with an  estimated date of confinement of June 7 with intrauterine pregnancy of 31  weeks complaining of back pain and pelvic pressure. Please see the dictated  history and physical.   HOSPITAL COURSE:  The patient was admitted.  She was given subcu  terbutaline. She was easily tocolyzed initially. However, she complained of  increased back pain and was given 24 hours of magnesium sulfate.  Again, her  cervix did not change from admission.  She was discharged to home on April  9.   DISCHARGE DIAGNOSIS:  Intrauterine pregnancy at 31+ weeks, threatened  preterm labor.   CONDITION:  Stable.   DIET:  Regular.   ACTIVITY:  Pelvic rest, modified bed rest.   MEDICATIONS:  Resume home medications.   DISPOSITION:  The patient was to follow up in the office in one week.      Roseanna Rainbow, M.D.  Electronically Signed     LAJ/MEDQ  D:  06/03/2005  T:  06/03/2005  Job:  [id number]

## 2010-09-12 ENCOUNTER — Emergency Department (HOSPITAL_COMMUNITY)
Admission: EM | Admit: 2010-09-12 | Discharge: 2010-09-12 | Disposition: A | Discharge status: Home / Self Care | Payer: Self-pay | Attending: Emergency Medicine | Admitting: Emergency Medicine

## 2010-09-12 ENCOUNTER — Emergency Department (HOSPITAL_COMMUNITY): Payer: Self-pay

## 2010-09-12 ENCOUNTER — Encounter (HOSPITAL_COMMUNITY): Payer: Self-pay | Admitting: Radiology

## 2010-09-12 DIAGNOSIS — R221 Localized swelling, mass and lump, neck: Secondary | ICD-10-CM | POA: Insufficient documentation

## 2010-09-12 DIAGNOSIS — L03211 Cellulitis of face: Secondary | ICD-10-CM | POA: Insufficient documentation

## 2010-09-12 DIAGNOSIS — K089 Disorder of teeth and supporting structures, unspecified: Secondary | ICD-10-CM | POA: Insufficient documentation

## 2010-09-12 DIAGNOSIS — R220 Localized swelling, mass and lump, head: Secondary | ICD-10-CM | POA: Insufficient documentation

## 2010-09-12 DIAGNOSIS — K029 Dental caries, unspecified: Secondary | ICD-10-CM | POA: Insufficient documentation

## 2010-09-12 DIAGNOSIS — L0201 Cutaneous abscess of face: Secondary | ICD-10-CM | POA: Insufficient documentation

## 2010-09-12 MED ORDER — IOHEXOL 300 MG/ML  SOLN
75.0000 mL | Freq: Once | INTRAMUSCULAR | Status: AC | PRN
  Administered 2010-09-12: 75 mL via INTRAVENOUS

## 2010-10-28 LAB — CBC
Hemoglobin: 10.9 — ABNORMAL LOW
Hemoglobin: 11.4 — ABNORMAL LOW
MCHC: 35
RBC: 3.87
RBC: 3.91
RDW: 12.4
WBC: 9.3
WBC: 7.2

## 2010-10-28 LAB — CREATININE, SERUM
Creatinine, Ser: 0.73
Creatinine, Ser: 0.8
GFR calc Af Amer: >60

## 2010-10-28 LAB — DIFFERENTIAL
Basophils Absolute: 0
Basophils Absolute: 0
Basophils Relative: 0
Eosinophils Relative: 3
Lymphocytes Relative: 20
Lymphocytes Relative: 23
Monocytes Absolute: 0.4
Monocytes Relative: 5
Monocytes Relative: 7
Neutro Abs: 4.8

## 2010-10-28 LAB — AST: AST: 14

## 2010-10-28 LAB — WET PREP, GENITAL: Trich, Wet Prep: NONE SEEN

## 2010-10-28 LAB — ABO/RH: ABO/RH(D): A POS

## 2010-10-28 LAB — HCG, QUANTITATIVE, PREGNANCY
hCG, Beta Chain, Quant, S: 1353 — ABNORMAL HIGH
hCG, Beta Chain, Quant, S: 1654 — ABNORMAL HIGH
hCG, Beta Chain, Quant, S: 3375 — ABNORMAL HIGH

## 2010-10-29 LAB — CBC
HCT: 24.6 — ABNORMAL LOW
Hemoglobin: 8 — ABNORMAL LOW
MCHC: 34
MCV: 85.4
Platelets: 202
Platelets: 240
RDW: 12.9
RDW: 13

## 2010-10-29 LAB — HCG, QUANTITATIVE, PREGNANCY: hCG, Beta Chain, Quant, S: 27 — ABNORMAL HIGH

## 2010-11-07 LAB — CBC
HCT: 30.1 — ABNORMAL LOW
HCT: 32.5 — ABNORMAL LOW
MCV: 88
Platelets: 179
RDW: 12.6
WBC: 8.6

## 2010-11-07 LAB — RPR: RPR Ser Ql: NONREACTIVE

## 2010-11-07 LAB — URINE MICROSCOPIC-ADD ON

## 2010-11-07 LAB — URINALYSIS, ROUTINE W REFLEX MICROSCOPIC
Ketones, ur: NEGATIVE
Protein, ur: NEGATIVE
Urobilinogen, UA: 0.2

## 2010-11-11 LAB — URINALYSIS, ROUTINE W REFLEX MICROSCOPIC
Bilirubin Urine: NEGATIVE
Bilirubin Urine: NEGATIVE
Glucose, UA: NEGATIVE
Ketones, ur: NEGATIVE
Ketones, ur: NEGATIVE
Nitrite: NEGATIVE
Nitrite: NEGATIVE
Protein, ur: NEGATIVE
Specific Gravity, Urine: 1.02
Urobilinogen, UA: 0.2
pH: 6.5
pH: 7

## 2010-11-11 LAB — URINE MICROSCOPIC-ADD ON

## 2011-10-03 ENCOUNTER — Inpatient Hospital Stay (HOSPITAL_COMMUNITY)
Admission: AD | Admit: 2011-10-03 | Discharge: 2011-10-03 | Disposition: A | Discharge status: Home / Self Care | Payer: Self-pay | Source: Ambulatory Visit | Attending: Family Medicine | Admitting: Family Medicine

## 2011-10-03 ENCOUNTER — Encounter (HOSPITAL_COMMUNITY): Payer: Self-pay

## 2011-10-03 ENCOUNTER — Inpatient Hospital Stay (HOSPITAL_COMMUNITY): Payer: Self-pay

## 2011-10-03 DIAGNOSIS — R000 Tachycardia, unspecified: Secondary | ICD-10-CM | POA: Insufficient documentation

## 2011-10-03 DIAGNOSIS — J189 Pneumonia, unspecified organism: Secondary | ICD-10-CM | POA: Insufficient documentation

## 2011-10-03 HISTORY — DX: Other specified health status: Z78.9

## 2011-10-03 MED ORDER — HYDROCOD POLST-CHLORPHEN POLST 10-8 MG/5ML PO LQCR
5.0000 mL | Freq: Once | ORAL | Status: AC
  Administered 2011-10-03: 5 mL via ORAL
  Filled 2011-10-03: qty 5

## 2011-10-03 MED ORDER — IBUPROFEN 600 MG PO TABS
600.0000 mg | ORAL_TABLET | Freq: Four times a day (QID) | ORAL | Status: DC | PRN
Start: 2011-10-03 — End: 2011-10-05

## 2011-10-03 MED ORDER — ACETAMINOPHEN-CODEINE #3 300-30 MG PO TABS: 1.0000 | ORAL_TABLET | Freq: Four times a day (QID) | ORAL | Status: AC | PRN

## 2011-10-03 MED ORDER — AZITHROMYCIN 250 MG PO TABS: 250.0000 mg | ORAL_TABLET | Freq: Every day | ORAL | Status: AC

## 2011-10-03 MED ORDER — IBUPROFEN 400 MG PO TABS
400.0000 mg | ORAL_TABLET | Freq: Once | ORAL | Status: AC
  Administered 2011-10-03: 400 mg via ORAL
  Filled 2011-10-03: qty 1

## 2011-10-03 NOTE — MAU Provider Note (Signed)
Chart reviewed and agree with management and plan.  

## 2011-10-03 NOTE — MAU Provider Note (Signed)
History     CSN: [id number]  Arrival date and time: 10/03/11 0344   First Provider Initiated Contact with Patient 10/03/11 0408      Chief Complaint  Patient presents with  . URI   HPI [name] 26 y.o. Comes to MAU after begin sick for 2 days.  Having cough, runny nose, chills, chest pain, feels thirsty, and weak.  Yesterday took Tylenol severe cold medication and today chest was hurting so much she did not take any medication.  Took a shot of alcohol but it did not help the chest pain.  Having chills and has a blanket with her.  0.5 pack per day smoker.  OB History    Grav Para Term Preterm Abortions TAB SAB Ect Mult Living   3 2 2  1   1  2       Past Medical History  Diagnosis Date  . No pertinent past medical history     Past Surgical History  Procedure Date  . Salpingectomy 2009    right, ectopic  . Nose surgery     to remove blood clot in nose    Family History  Problem Relation Age of Onset  . Other Neg Hx     History  Substance Use Topics  . Smoking status: Current Everyday Smoker -- 0.5 packs/day for 10 years    Types: Cigarettes  . Smokeless tobacco: Not on file  . Alcohol Use: Yes    Allergies: No Known Allergies  No prescriptions prior to admission    Review of Systems  Constitutional: Positive for chills.  HENT: Negative for sore throat.   Respiratory: Positive for cough, sputum production and wheezing.   Cardiovascular: Positive for chest pain.  Gastrointestinal: Negative for abdominal pain.  Genitourinary:       On menses  Musculoskeletal: Positive for back pain.  Neurological: Positive for headaches.   Physical Exam   Blood pressure 110/53, pulse 113, temperature 98.3 F (36.8 C), temperature source Oral, resp. rate 18, height 5\' 3"  (1.6 m), weight 142 lb 6.4 oz (64.592 kg), last menstrual period 10/02/2011, SpO2 97.00%.  Physical Exam  Nursing note and vitals reviewed. Constitutional: She is oriented to person, place, and  time. She appears well-developed and well-nourished.  HENT:  Head: Normocephalic.  Eyes: EOM are normal.  Neck: Neck supple.  Cardiovascular: Regular rhythm.   No murmur heard.      Mild tachycardia  Respiratory: Effort normal and breath sounds normal. No respiratory distress.       Significant cough with deep breath, minimal sputum production with cough, "hacking" in nature, reluctant to take a deep breath, O2 sat 97%  Musculoskeletal: Normal range of motion.       Has pain between shoulders.  Neurological: She is alert and oriented to person, place, and time.  Skin: Skin is warm and dry.  Psychiatric: She has a normal mood and affect.    MAU Course  Procedures Results for orders placed during the hospital encounter of 10/03/11 (from the past 24 hour(s))  POCT PREGNANCY, URINE     Status: Normal   Collection Time   10/03/11  4:34 AM      Component Value Range   Preg Test, Ur NEGATIVE  NEGATIVE   *RADIOLOGY REPORT*  Clinical Data: Severe cough  CHEST - 2 VIEW  Comparison: None  Findings: Heart size appears normal.  No pleural effusion identified.  There is an opacity within the right lung base which is worrisome  for pneumonia.  Left lung is clear.  The patient has a pectus deformity.  IMPRESSION:  1. Right lung base opacity worrisome for pneumonia.   MDM Consult with Dr. Dierdre Highman at [facility] for plan of care.  Will get chest X-ray to R/O pneumonia.  Assessment and Plan  Pneumonia Mild tachycardia  Plan Z pack Ibuprofen 600 mg po q 6 hours with food (#30) no refills Tylenol #3 one po q 4-6 hours for cough and pain (#20) no refills Go to [facility] or to the ER if your condition does not improve.  [name] 10/03/2011, 4:15 AM

## 2011-10-03 NOTE — MAU Note (Signed)
Cold symptoms since 5pm Friday afternoon. Chest pain that is worse when she coughs. Also complains of headache. Denies gyn complaints & states is currently on her period. Coughing up green mucous x2 days.

## 2011-10-05 ENCOUNTER — Encounter (HOSPITAL_COMMUNITY): Payer: Self-pay | Admitting: *Deleted

## 2011-10-05 ENCOUNTER — Emergency Department (HOSPITAL_COMMUNITY): Payer: Self-pay

## 2011-10-05 ENCOUNTER — Emergency Department (HOSPITAL_COMMUNITY)
Admission: EM | Admit: 2011-10-05 | Discharge: 2011-10-05 | Disposition: A | Discharge status: Home / Self Care | Payer: Self-pay | Attending: Emergency Medicine | Admitting: Emergency Medicine

## 2011-10-05 DIAGNOSIS — R509 Fever, unspecified: Secondary | ICD-10-CM | POA: Insufficient documentation

## 2011-10-05 DIAGNOSIS — R05 Cough: Secondary | ICD-10-CM | POA: Insufficient documentation

## 2011-10-05 DIAGNOSIS — R059 Cough, unspecified: Secondary | ICD-10-CM | POA: Insufficient documentation

## 2011-10-05 DIAGNOSIS — J189 Pneumonia, unspecified organism: Secondary | ICD-10-CM

## 2011-10-05 DIAGNOSIS — F17200 Nicotine dependence, unspecified, uncomplicated: Secondary | ICD-10-CM | POA: Insufficient documentation

## 2011-10-05 MED ORDER — ONDANSETRON 8 MG PO TBDP
8.0000 mg | ORAL_TABLET | Freq: Once | ORAL | Status: AC
  Administered 2011-10-05: 8 mg via ORAL
  Filled 2011-10-05: qty 1

## 2011-10-05 MED ORDER — AMOXICILLIN 500 MG PO CAPS: 1000.0000 mg | ORAL_CAPSULE | Freq: Three times a day (TID) | ORAL | Status: AC

## 2011-10-05 MED ORDER — ONDANSETRON 8 MG PO TBDP: 8.0000 mg | ORAL_TABLET | Freq: Three times a day (TID) | ORAL | Status: AC | PRN

## 2011-10-05 MED ORDER — HYDROCODONE-ACETAMINOPHEN 5-325 MG PO TABS: 1.0000 | ORAL_TABLET | Freq: Four times a day (QID) | ORAL | Status: AC | PRN

## 2011-10-05 NOTE — ED Notes (Signed)
Pt states that she has been sick for about a 2 weeks and was seen at [facility] on 8/31 and diagnosed with PNA. She was started on abx-can't remember name-but she is not getting better. She actually feels like she is getting worse.

## 2011-10-05 NOTE — ED Notes (Signed)
Pt coughing, reports pain in her chest about a 7/10.  Alert and oriented.  Pt taking Zithromax, started in on Sat.  Pt reporting she has vomited after coughing x 2 in the last 24 hours.

## 2011-10-05 NOTE — ED Provider Notes (Signed)
History     CSN: [id number]  Arrival date & time 10/05/11  1359   First MD Initiated Contact with Patient 10/05/11 1506      Chief Complaint  Patient presents with  . Cough  . Fever  . Chills    (Consider location/radiation/quality/duration/timing/severity/associated sxs/prior treatment) Patient is a 26 y.o. female presenting with cough and fever. The history is provided by the patient.  Cough This is a new problem. The current episode started more than 1 week ago. The problem occurs constantly. The problem has not changed since onset.The cough is non-productive. There has been no fever. Associated symptoms include chest pain, chills, rhinorrhea, sore throat and myalgias. Treatments tried: Pt was seen on friday and started abx on saturday.    Fever Primary symptoms of the febrile illness include fever, cough, nausea, vomiting and myalgias. Primary symptoms do not include diarrhea or dysuria.  Pt does not feel like she is getting better.  Her tonsils feels swollen.   Past Medical History  Diagnosis Date  . No pertinent past medical history     Past Surgical History  Procedure Date  . Salpingectomy 2009    right, ectopic  . Nose surgery     to remove blood clot in nose    Family History  Problem Relation Age of Onset  . Other Neg Hx     History  Substance Use Topics  . Smoking status: Current Everyday Smoker -- 0.5 packs/day for 10 years    Types: Cigarettes  . Smokeless tobacco: Not on file  . Alcohol Use: Yes    OB History    Grav Para Term Preterm Abortions TAB SAB Ect Mult Living   3 2 2  1   1  2       Review of Systems  Constitutional: Positive for fever and chills.  HENT: Positive for sore throat and rhinorrhea.   Respiratory: Positive for cough.   Cardiovascular: Positive for chest pain.  Gastrointestinal: Positive for nausea and vomiting. Negative for diarrhea.  Genitourinary: Negative for dysuria.  Musculoskeletal: Positive for myalgias.  All  other systems reviewed and are negative.    Allergies  Review of patient's allergies indicates no known allergies.  Home Medications   Current Outpatient Rx  Name Route Sig Dispense Refill  . ACETAMINOPHEN-CODEINE #3 300-30 MG PO TABS Oral Take 1 tablet by mouth every 6 (six) hours as needed for pain. 20 tablet 0  . AZITHROMYCIN 250 MG PO TABS Oral Take 1 tablet (250 mg total) by mouth daily. Take first 2 tablets together, then 1 every day until finished. 6 tablet 0  . DM-DOXYLAMINE-ACETAMINOPHEN 30-12.06-998 MG/30ML PO LIQD Oral Take 15 mLs by mouth every 6 (six) hours as needed. For cough and cold      BP 108/67  Pulse 91  Temp 99.6 F (37.6 C)  SpO2 98%  LMP 10/02/2011  Physical Exam  Nursing note and vitals reviewed. Constitutional: She appears well-developed and well-nourished. No distress.  HENT:  Head: Normocephalic and atraumatic.  Right Ear: External ear normal.  Left Ear: External ear normal.  Mouth/Throat: No oropharyngeal exudate (mild erythema).  Eyes: Conjunctivae are normal. Right eye exhibits no discharge. Left eye exhibits no discharge. No scleral icterus.  Neck: Neck supple. No tracheal deviation present.  Cardiovascular: Normal rate, regular rhythm and intact distal pulses.   Pulmonary/Chest: Effort normal and breath sounds normal. No stridor. No respiratory distress. She has no wheezes. She has no rales.  Abdominal: Soft. Bowel sounds  are normal. She exhibits no distension. There is no tenderness. There is no rebound and no guarding.  Musculoskeletal: She exhibits no edema and no tenderness.  Neurological: She is alert. She has normal strength. No sensory deficit. Cranial nerve deficit:  no gross defecits noted. She exhibits normal muscle tone. She displays no seizure activity. Coordination normal.  Skin: Skin is warm and dry. No rash noted.  Psychiatric: She has a normal mood and affect.    ED Course  Procedures (including critical care time)  Labs  Reviewed - No data to display Dg Chest 2 View  10/05/2011  *RADIOLOGY REPORT*  Clinical Data: Cough and fever  CHEST - 2 VIEW  Comparison: 10/03/2011  Findings: Heart size is normal.  No pleural effusion or edema. Airspace consolidation within the right middle lobe is again noted. No new findings identified.  The visualized osseous structures are unremarkable.  IMPRESSION:  1.  Persistent right base opacity worrisome for pneumonia.   Original Report Authenticated By: Rosealee Albee, M.D.       MDM  Patient states she was diagnosed with pneumonia over the weekend and has been taking azithromycin. Patient states she does not feel any better. She is not in any respiratory distress. She is only been on antibiotics for approximately 2 days ago at this time that she needs admitted to the hospital. We'll add on amoxicillin for better strep pneumo coverage.  followup with her primary care Dr. to be rechecked later this week       Celene Kras, MD 10/05/11 [phone]

## 2013-04-15 ENCOUNTER — Encounter (HOSPITAL_BASED_OUTPATIENT_CLINIC_OR_DEPARTMENT_OTHER): Payer: Self-pay | Admitting: Emergency Medicine

## 2013-04-15 DIAGNOSIS — N390 Urinary tract infection, site not specified: Secondary | ICD-10-CM | POA: Insufficient documentation

## 2013-04-15 DIAGNOSIS — Z3202 Encounter for pregnancy test, result negative: Secondary | ICD-10-CM | POA: Insufficient documentation

## 2013-04-15 DIAGNOSIS — F17200 Nicotine dependence, unspecified, uncomplicated: Secondary | ICD-10-CM | POA: Insufficient documentation

## 2013-04-15 DIAGNOSIS — N898 Other specified noninflammatory disorders of vagina: Secondary | ICD-10-CM | POA: Insufficient documentation

## 2013-04-15 NOTE — ED Notes (Addendum)
Presents with chief complaint 'My tummy hurts.'  C/o diffuse abdominal x three days, denies nausea, vomiting, diarrhea, fever, dysuria, vaginal discharge.  Is sexually active with a female partner, occasionally uses a condom.  Reports she is unsure if she is pregnant, but verbalizes 'I might.'  No home pregnancy test done.

## 2013-04-16 ENCOUNTER — Emergency Department (HOSPITAL_BASED_OUTPATIENT_CLINIC_OR_DEPARTMENT_OTHER)
Admission: EM | Admit: 2013-04-16 | Discharge: 2013-04-16 | Disposition: A | Discharge status: Home / Self Care | Payer: Self-pay | Attending: Emergency Medicine | Admitting: Emergency Medicine

## 2013-04-16 DIAGNOSIS — N390 Urinary tract infection, site not specified: Secondary | ICD-10-CM

## 2013-04-16 LAB — URINALYSIS, ROUTINE W REFLEX MICROSCOPIC
BILIRUBIN URINE: NEGATIVE
Glucose, UA: NEGATIVE mg/dL
KETONES UR: NEGATIVE mg/dL
NITRITE: NEGATIVE
PH: 6 (ref 5.0–8.0)
Protein, ur: NEGATIVE mg/dL
SPECIFIC GRAVITY, URINE: 1.022 (ref 1.005–1.030)
UROBILINOGEN UA: 0.2 mg/dL (ref 0.0–1.0)

## 2013-04-16 LAB — URINE MICROSCOPIC-ADD ON

## 2013-04-16 LAB — PREGNANCY, URINE: Preg Test, Ur: NEGATIVE

## 2013-04-16 MED ORDER — CEPHALEXIN 500 MG PO CAPS: 500.0000 mg | ORAL_CAPSULE | Freq: Four times a day (QID) | ORAL | Status: DC

## 2013-04-16 NOTE — ED Notes (Signed)
Pt reports epigastric and low abd pain onset  3- 4 days with frequency and urgency of urination. Admits to occasional spotting of blood  and reports normal menuses  this month. Also reports increased pain with urination.

## 2013-04-16 NOTE — ED Provider Notes (Signed)
CSN: [id number]     Arrival date & time 04/15/13  2338 History  This chart was scribed for Calen Geister Smitty CordsK Jarae Nemmers-Rasch, MD by Elveria Risingimelie Horne, ED scribe.  This patient was seen in room MH08/MH08 and the patient's care was started at 12:14 AM.   Chief Complaint  Patient presents with  . Abdominal Pain      Patient is a 28 y.o. female presenting with abdominal pain. The history is provided by the patient. History limited by: patient refuses to communicate with MD. No language interpreter was used.  Abdominal Pain Pain location:  Generalized Pain quality: cramping   Pain severity:  Moderate Duration:  3 days Timing:  Constant Progression:  Worsening Chronicity:  New Context: recent sexual activity   Relieved by:  Nothing Worsened by:  Nothing tried Ineffective treatments:  None tried Associated symptoms: vaginal bleeding   Associated symptoms: no diarrhea, no fever, no nausea, no vaginal discharge and no vomiting   Risk factors: not pregnant    HPI Comments: [name] is a 28 y.o. female who presents to the Emergency Department complaining of cramping lower and upper abdominal pain, onset three days ago. Additionally patient reports urinary frequency and vaginal bleeding. Patient shares that she had a normal menstrual cycle two days ago and says she has been spotting since. Patient has not taken medication to manage pain. Patient is currently sexually active and is unsure if she is pregnant.   Past Medical History  Diagnosis Date  . No pertinent past medical history    Past Surgical History  Procedure Laterality Date  . Salpingectomy  2009    right, ectopic  . Nose surgery      to remove blood clot in nose   Family History  Problem Relation Age of Onset  . Other Neg Hx    History  Substance Use Topics  . Smoking status: Current Every Day Smoker -- 0.50 packs/day for 10 years    Types: Cigarettes  . Smokeless tobacco: Not on file  . Alcohol Use: Yes   OB History   Grav  Para Term Preterm Abortions TAB SAB Ect Mult Living   3 2 2  1   1  2      Review of Systems  Constitutional: Negative for fever.  Gastrointestinal: Positive for abdominal pain. Negative for nausea, vomiting and diarrhea.  Genitourinary: Positive for frequency and vaginal bleeding. Negative for vaginal discharge.  All other systems reviewed and are negative.      Allergies  Review of patient's allergies indicates no known allergies.  Home Medications   Current Outpatient Rx  Name  Route  Sig  Dispense  Refill  . DM-Doxylamine-Acetaminophen (TYLENOL COUGH/SORE THROAT) 30-12.06-998 MG/30ML LIQD   Oral   Take 15 mLs by mouth every 6 (six) hours as needed. For cough and cold          Triage Vitals: BP 123/71  Pulse 79  Temp(Src) 98.8 F (37.1 C) (Oral)  Resp 18  Ht 5\' 2"  (1.575 m)  Wt 150 lb (68.04 kg)  BMI 27.43 kg/m2  SpO2 100%  LMP 04/03/2013 Physical Exam  Nursing note and vitals reviewed. Constitutional: She is oriented to person, place, and time. She appears well-developed and well-nourished. No distress.  HENT:  Head: Normocephalic and atraumatic.  Mouth/Throat: Oropharynx is clear and moist. No oropharyngeal exudate.  Eyes: EOM are normal. Pupils are equal, round, and reactive to light.  Neck: Neck supple. No tracheal deviation present.  Cardiovascular: Normal rate,  regular rhythm, normal heart sounds and intact distal pulses.   No murmur heard. Pulmonary/Chest: Effort normal. No respiratory distress.  Abdominal: Soft. She exhibits no mass. There is no tenderness. There is no rebound and no guarding.  Hyperactive bowel sounds.   Musculoskeletal: Normal range of motion.  Neurological: She is alert and oriented to person, place, and time.  Skin: Skin is warm and dry.  Psychiatric: She has a normal mood and affect. Her behavior is normal.    ED Course  Procedures (including critical care time) DIAGNOSTIC STUDIES: Oxygen Saturation is 100% on room air,  normaly by my interpretation.    COORDINATION OF CARE: 12:14 AM- Pt advised of plan for treatment and pt agrees.    Labs Review Labs Reviewed  PREGNANCY, URINE  URINALYSIS, ROUTINE W REFLEX MICROSCOPIC   Imaging Review No results found.   EKG Interpretation None      MDM   Final diagnoses:  None    Patient originally consented to pelvic examination and then declined.  Will d/x with abx for UTI  I personally performed the services described in this documentation, which was scribed in my presence. The recorded information has been reviewed and is accurate.     Jasmine Awe, MD 04/16/13 [phone]

## 2013-12-04 ENCOUNTER — Encounter (HOSPITAL_BASED_OUTPATIENT_CLINIC_OR_DEPARTMENT_OTHER): Payer: Self-pay | Admitting: Emergency Medicine

## 2014-04-24 IMAGING — CR DG CHEST 2V
2 series · 2 of 2 positions shown · non-contrast
Comparison: 10/03/2011

CLINICAL DATA: Cough and fever

CHEST - 2 VIEW

[w chest pa]
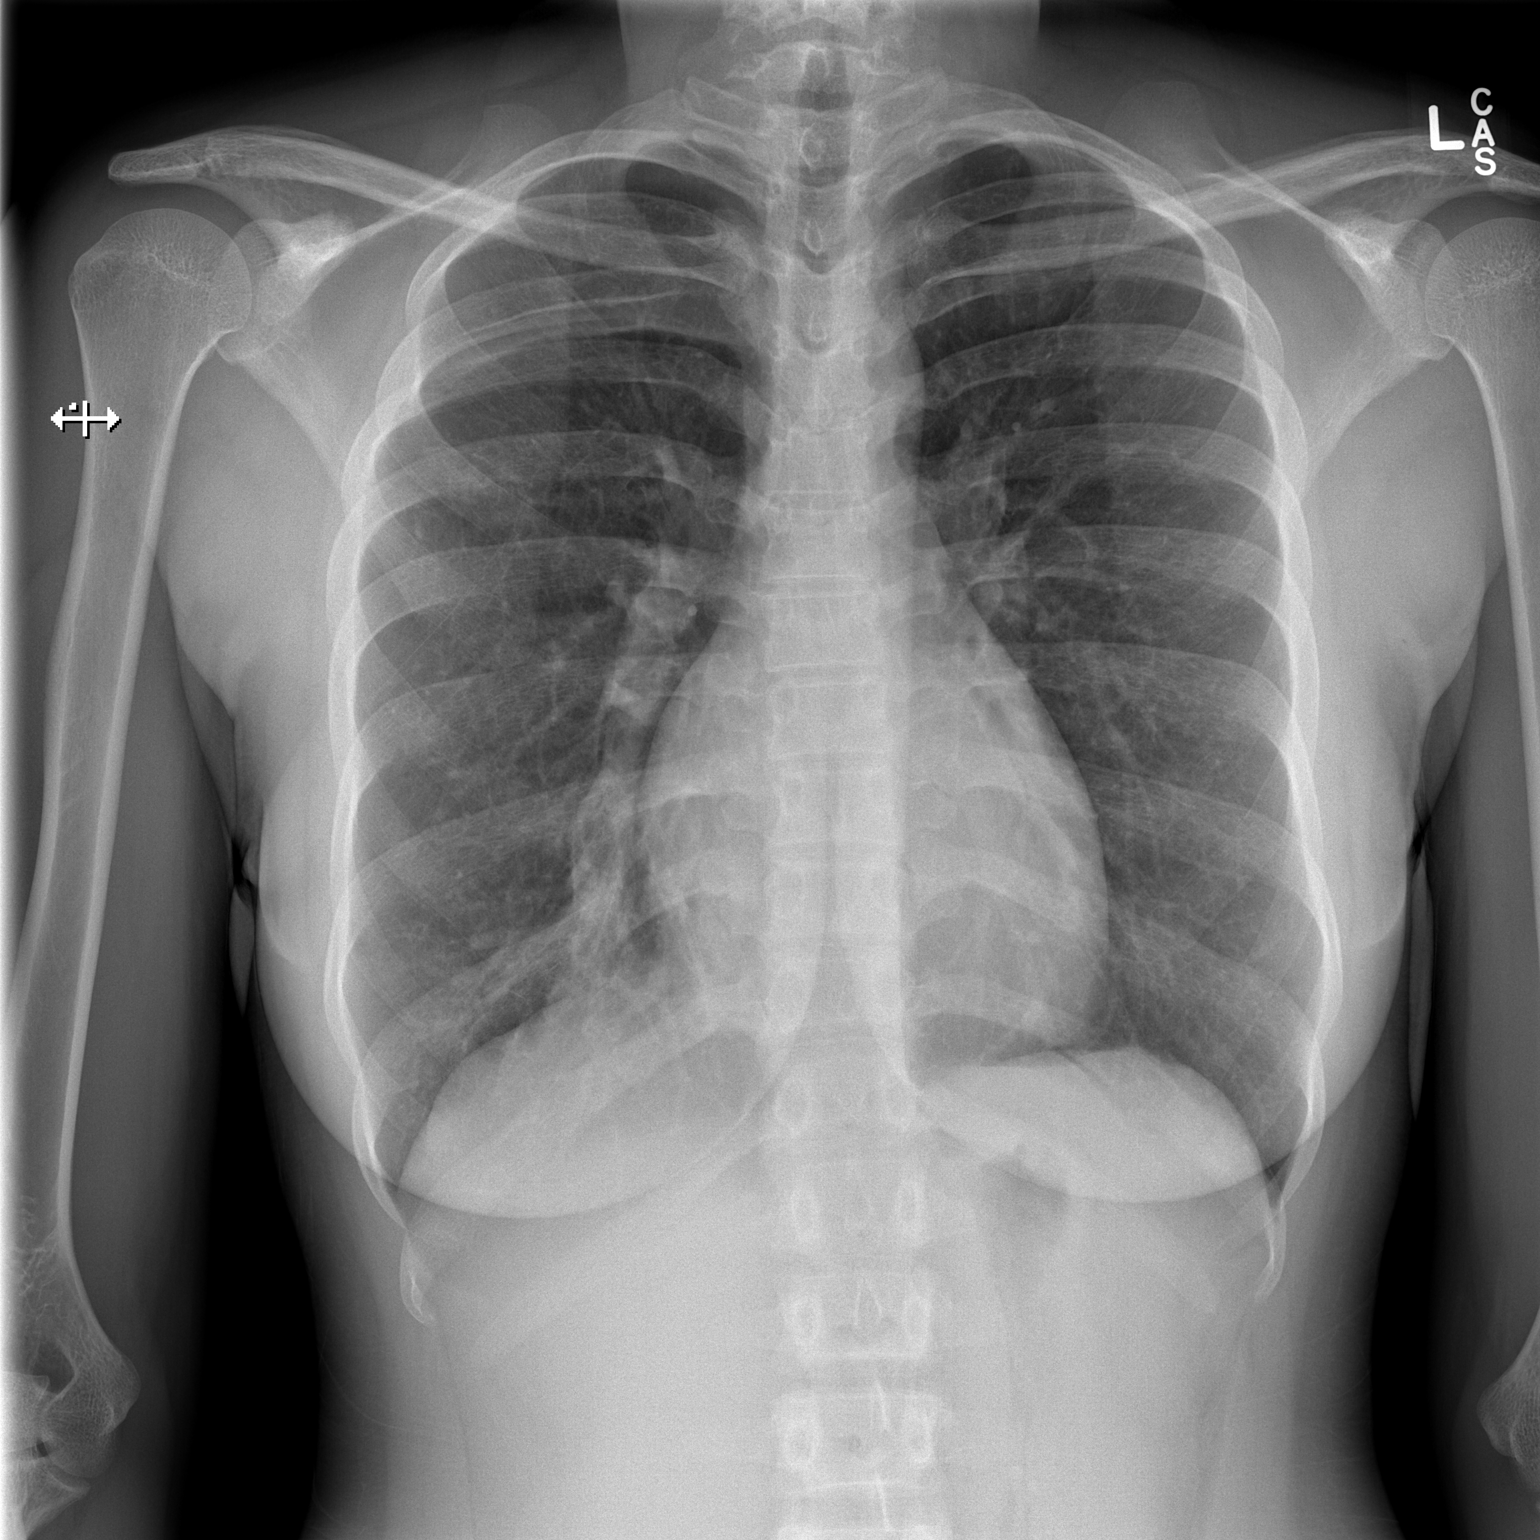

[w chest lat]
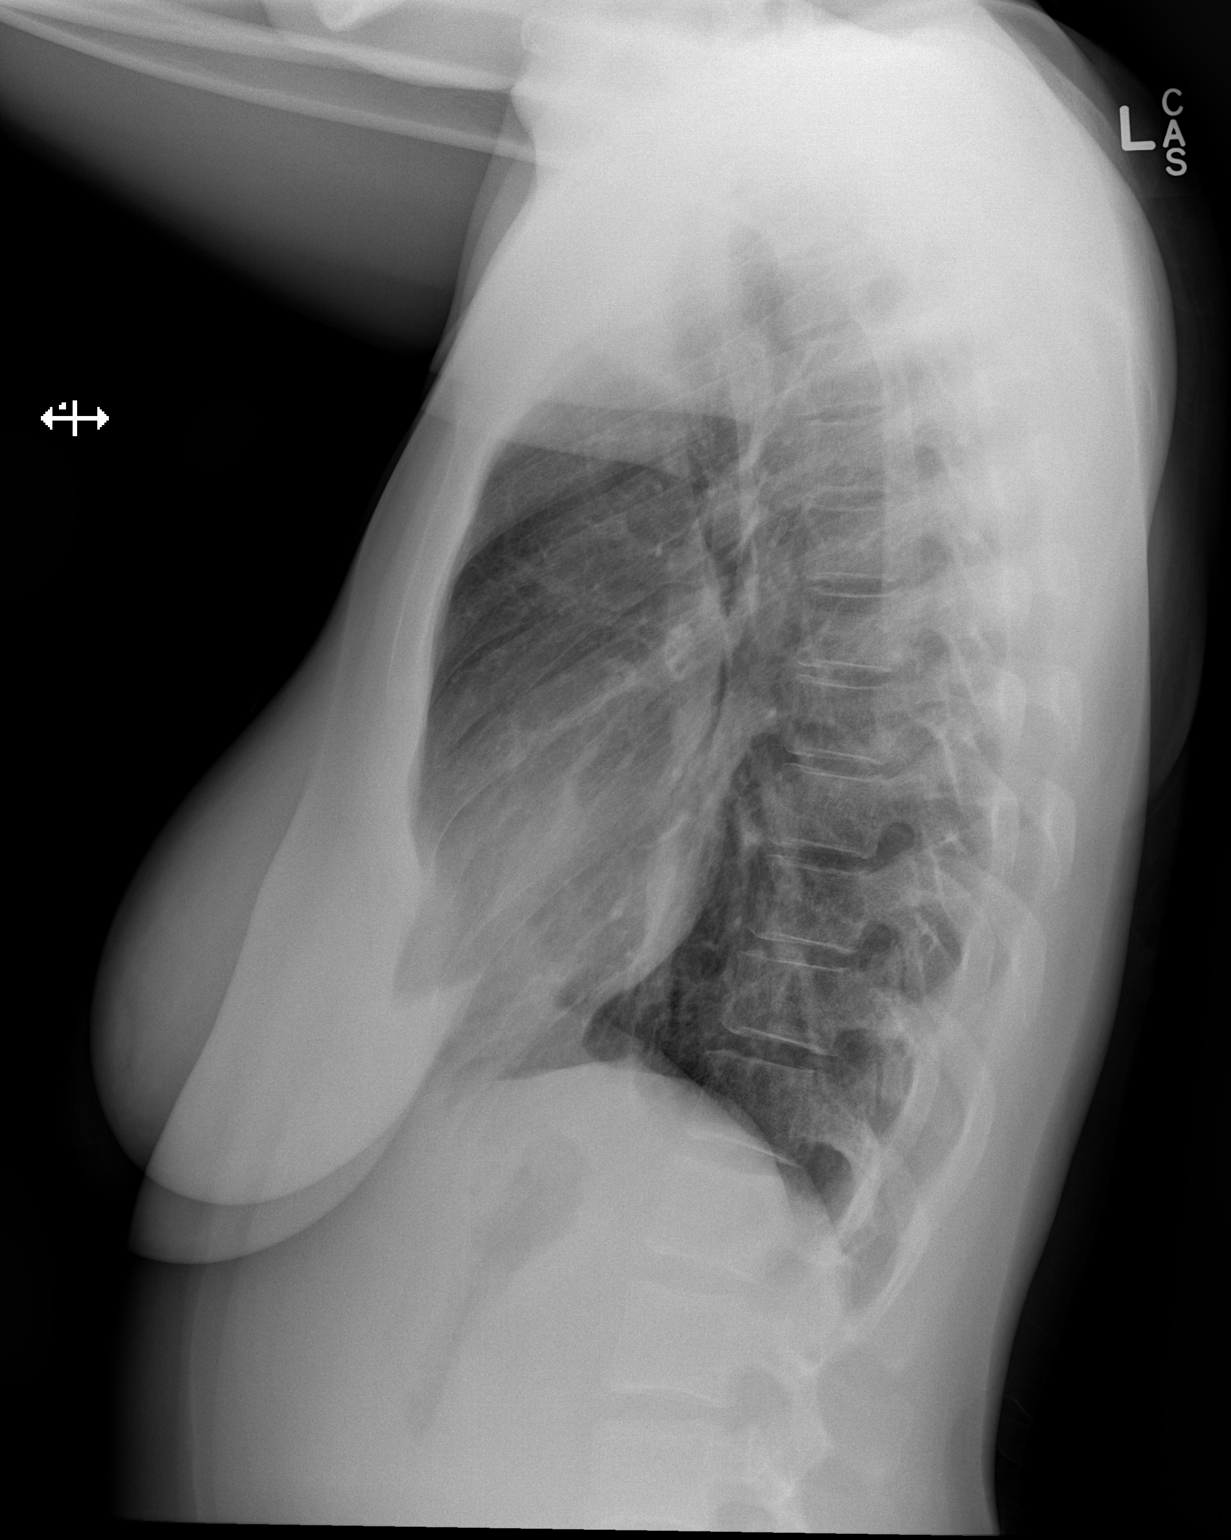

[2 of 2 positions shown; findings below may reference images not displayed]

FINDINGS: Heart size is normal.  No pleural effusion or edema.
Airspace consolidation within the right middle lobe is again noted.
No new findings identified.  The visualized osseous structures are
unremarkable.
IMPRESSION: 1.  Persistent right base opacity worrisome for pneumonia.

## 2016-03-12 ENCOUNTER — Encounter (HOSPITAL_COMMUNITY): Payer: Self-pay | Admitting: Emergency Medicine

## 2016-03-12 ENCOUNTER — Ambulatory Visit (HOSPITAL_COMMUNITY)
Admission: EM | Admit: 2016-03-12 | Discharge: 2016-03-12 | Disposition: A | Discharge status: Home / Self Care | Payer: Self-pay | Attending: Emergency Medicine | Admitting: Emergency Medicine

## 2016-03-12 DIAGNOSIS — Z113 Encounter for screening for infections with a predominantly sexual mode of transmission: Secondary | ICD-10-CM

## 2016-03-12 DIAGNOSIS — Z79899 Other long term (current) drug therapy: Secondary | ICD-10-CM | POA: Insufficient documentation

## 2016-03-12 DIAGNOSIS — Z202 Contact with and (suspected) exposure to infections with a predominantly sexual mode of transmission: Secondary | ICD-10-CM | POA: Insufficient documentation

## 2016-03-12 DIAGNOSIS — R109 Unspecified abdominal pain: Secondary | ICD-10-CM | POA: Insufficient documentation

## 2016-03-12 DIAGNOSIS — Z9889 Other specified postprocedural states: Secondary | ICD-10-CM | POA: Insufficient documentation

## 2016-03-12 DIAGNOSIS — B9689 Other specified bacterial agents as the cause of diseases classified elsewhere: Secondary | ICD-10-CM

## 2016-03-12 DIAGNOSIS — F17210 Nicotine dependence, cigarettes, uncomplicated: Secondary | ICD-10-CM | POA: Insufficient documentation

## 2016-03-12 DIAGNOSIS — N760 Acute vaginitis: Secondary | ICD-10-CM | POA: Insufficient documentation

## 2016-03-12 LAB — POCT URINALYSIS DIP (DEVICE)
Bilirubin Urine: NEGATIVE
Glucose, UA: NEGATIVE mg/dL
Ketones, ur: NEGATIVE mg/dL
LEUKOCYTES UA: NEGATIVE
NITRITE: NEGATIVE
Protein, ur: NEGATIVE mg/dL
Specific Gravity, Urine: 1.025 (ref 1.005–1.030)
UROBILINOGEN UA: 0.2 mg/dL (ref 0.0–1.0)
pH: 6.5 (ref 5.0–8.0)

## 2016-03-12 LAB — POCT PREGNANCY, URINE: Preg Test, Ur: NEGATIVE

## 2016-03-12 MED ORDER — METRONIDAZOLE 500 MG PO TABS: 500.0000 mg | ORAL_TABLET | Freq: Two times a day (BID) | ORAL | 0 refills | Status: AC

## 2016-03-12 MED ORDER — AZITHROMYCIN 250 MG PO TABS
1000.0000 mg | ORAL_TABLET | Freq: Once | ORAL | Status: AC
  Administered 2016-03-12: 1000 mg via ORAL

## 2016-03-12 MED ORDER — AZITHROMYCIN 250 MG PO TABS
ORAL_TABLET | ORAL | Status: AC
  Filled 2016-03-12: qty 4

## 2016-03-12 MED ORDER — LIDOCAINE HCL (PF) 2 % IJ SOLN
INTRAMUSCULAR | Status: AC
  Filled 2016-03-12: qty 2

## 2016-03-12 MED ORDER — CEFTRIAXONE SODIUM 250 MG IJ SOLR
250.0000 mg | Freq: Once | INTRAMUSCULAR | Status: AC
  Administered 2016-03-12: 250 mg via INTRAMUSCULAR

## 2016-03-12 MED ORDER — CEFTRIAXONE SODIUM 250 MG IJ SOLR
INTRAMUSCULAR | Status: AC
  Filled 2016-03-12: qty 250

## 2016-03-12 NOTE — Discharge Instructions (Signed)
You have been screened for multiple types of infection diseases such as Gonorrhea, Chlamydia, bacteria vaginosis, and yeast. You will be notified of the results of these tests in 24-72 hours. If any therapy needs to be started or if your current therapy needs to be changed, you will be notified and and given instructions as to what to do. If your symptoms persist or fail to resolve, follow up with your primary care provider or return to clinic.   Based on your symptoms and findings on physical exam, you are being treated for an infection. You have received an injection of Rocephin, 250 mg and a tablet of Azithromycin, 1 gram. To treat trichomoniasis and gardnerella, I have prescribed metronidazole. Take 1 tablet twice a day for 7 days. Do not drink any alcohol while taking this medicine as it can make you very ill.   I would recommend following up with your primary care provider, the health department, or return to clinic in 1 week for re-screening to ensure clearance of the infection.

## 2016-03-12 NOTE — ED Provider Notes (Signed)
CSN: [id number]     Arrival date & time 03/12/16  1305 History   None    Chief Complaint  Patient presents with  . Abdominal Pain  . Exposure to STD   (Consider location/radiation/quality/duration/timing/severity/associated sxs/prior Treatment) 31 year old female presents with chief complaint of white vaginal discharge, denies any abdominal pain, flank pain, fever, or nausea, she requests pelvic exam and std testing.       Past Medical History:  Diagnosis Date  . No pertinent past medical history    Past Surgical History:  Procedure Laterality Date  . NOSE SURGERY     to remove blood clot in nose  . SALPINGECTOMY  2009   right, ectopic   Family History  Problem Relation Age of Onset  . Other Neg Hx    Social History  Substance Use Topics  . Smoking status: Current Every Day Smoker    Packs/day: 0.50    Years: 10.00    Types: Cigarettes  . Smokeless tobacco: Never Used  . Alcohol use Yes   OB History    Gravida Para Term Preterm AB Living   3 2 2   1 2    SAB TAB Ectopic Multiple Live Births       1         Review of Systems  Reason unable to perform ROS: as covered in HPI.  All other systems reviewed and are negative.   Allergies  Patient has no known allergies.  Home Medications   Prior to Admission medications   Medication Sig Start Date End Date Taking? Authorizing Provider  cephALEXin (KEFLEX) 500 MG capsule Take 1 capsule (500 mg total) by mouth 4 (four) times daily. 04/16/13   April Palumbo, MD  DM-Doxylamine-Acetaminophen (TYLENOL COUGH/SORE THROAT) 30-12.06-998 MG/30ML LIQD Take 15 mLs by mouth every 6 (six) hours as needed. For cough and cold    Historical Provider, MD   Meds Ordered and Administered this Visit   Medications  azithromycin (ZITHROMAX) tablet 1,000 mg (not administered)  cefTRIAXone (ROCEPHIN) injection 250 mg (not administered)    BP 103/71 (BP Location: Right Arm)   Pulse 72   Temp 98.1 F (36.7 C) (Oral)   Resp 18   LMP  02/26/2016   SpO2 100%  No data found.   Physical Exam  Constitutional: She is oriented to person, place, and time. She appears well-developed and well-nourished. No distress.  Cardiovascular: Normal rate and regular rhythm.   Pulmonary/Chest: Effort normal.  Abdominal: Soft. Bowel sounds are normal. She exhibits no distension and no mass. There is no tenderness. There is no guarding and no CVA tenderness. Hernia confirmed negative in the right inguinal area and confirmed negative in the left inguinal area.  Genitourinary: Pelvic exam was performed with patient prone. There is no rash, tenderness, lesion or injury on the right labia. There is no rash, tenderness, lesion or injury on the left labia. Cervix exhibits discharge (copious white discharge). Cervix exhibits no motion tenderness and no friability. No erythema or bleeding in the vagina. No foreign body in the vagina. Vaginal discharge found.  Lymphadenopathy:       Right: No inguinal adenopathy present.       Left: No inguinal adenopathy present.  Neurological: She is alert and oriented to person, place, and time.  Skin: Skin is warm and dry. Capillary refill takes less than 2 seconds. She is not diaphoretic.  Psychiatric: She has a normal mood and affect.  Nursing note and vitals reviewed.  Urgent Care Course     Procedures (including critical care time)  Labs Review Labs Reviewed  POCT URINALYSIS DIP (DEVICE) - Abnormal; Notable for the following:       Result Value   Hgb urine dipstick TRACE (*)    All other components within normal limits  POCT PREGNANCY, URINE  CERVICOVAGINAL ANCILLARY ONLY    Imaging Review No results found.   Visual Acuity Review  Right Eye Distance:   Left Eye Distance:   Bilateral Distance:    Right Eye Near:   Left Eye Near:    Bilateral Near:         MDM   1. BV (bacterial vaginosis)   2. Screening examination for STD (sexually transmitted disease)    You have been screened  for multiple types of infection diseases such as Gonorrhea, Chlamydia, bacteria vaginosis, and yeast. You will be notified of the results of these tests in 24-72 hours. If any therapy needs to be started or if your current therapy needs to be changed, you will be notified and and given instructions as to what to do. If your symptoms persist or fail to resolve, follow up with your primary care provider or return to clinic.   Based on your symptoms and findings on physical exam, you are being treated for an infection. You have received an injection of Rocephin, 250 mg and a tablet of Azithromycin, 1 gram. To treat trichomoniasis and gardnerella, I have prescribed metronidazole. Take 1 tablet twice a day for 7 days. Do not drink any alcohol while taking this medicine as it can make you very ill.   I would recommend following up with your primary care provider, the health department, or return to clinic in 1 week for re-screening to ensure clearance of the infection.       Dorena Bodo, NP 03/12/16 1539

## 2016-03-12 NOTE — ED Triage Notes (Addendum)
Here for intermittent abd pain onset 1 week associated w/vag d/c  Would also like to be checked for STDs  Sexually active in a monogamous relationship x6 years and no condom use   Denies urinary sx, fevers, chills  A&O x4... NAD

## 2016-03-13 LAB — CERVICOVAGINAL ANCILLARY ONLY
CHLAMYDIA, DNA PROBE: NEGATIVE
NEISSERIA GONORRHEA: NEGATIVE
Wet Prep (BD Affirm): POSITIVE — AB
